# Patient Record
Sex: Female | Born: 1955 | Race: Black or African American | Hispanic: No | Marital: Married | Smoking: Never smoker
Health system: Southern US, Community
[De-identification: ages and names within clinical notes are randomized; demographics above are authoritative.]

## PROBLEM LIST (undated history)

## (undated) DIAGNOSIS — E039 Hypothyroidism, unspecified: Secondary | ICD-10-CM

## (undated) DIAGNOSIS — E119 Type 2 diabetes mellitus without complications: Secondary | ICD-10-CM

## (undated) DIAGNOSIS — M65321 Trigger finger, right index finger: Secondary | ICD-10-CM

## (undated) DIAGNOSIS — K219 Gastro-esophageal reflux disease without esophagitis: Secondary | ICD-10-CM

## (undated) DIAGNOSIS — E785 Hyperlipidemia, unspecified: Secondary | ICD-10-CM

## (undated) DIAGNOSIS — Z860100 Personal history of colon polyps, unspecified: Secondary | ICD-10-CM

## (undated) DIAGNOSIS — I10 Essential (primary) hypertension: Secondary | ICD-10-CM

## (undated) DIAGNOSIS — Z800 Family history of malignant neoplasm of digestive organs: Secondary | ICD-10-CM

## (undated) DIAGNOSIS — N200 Calculus of kidney: Secondary | ICD-10-CM

## (undated) DIAGNOSIS — K589 Irritable bowel syndrome without diarrhea: Secondary | ICD-10-CM

## (undated) DIAGNOSIS — D509 Iron deficiency anemia, unspecified: Secondary | ICD-10-CM

## (undated) HISTORY — DX: Iron deficiency anemia, unspecified: D50.9

## (undated) HISTORY — DX: Irritable bowel syndrome, unspecified: K58.9

## (undated) HISTORY — DX: Essential (primary) hypertension: I10

## (undated) HISTORY — DX: Family history of malignant neoplasm of digestive organs: Z80.0

## (undated) HISTORY — DX: Trigger finger, right index finger: M65.321

## (undated) HISTORY — DX: Hyperlipidemia, unspecified: E78.5

## (undated) HISTORY — DX: Calculus of kidney: N20.0

## (undated) HISTORY — DX: Personal history of colonic polyps: Z86.010

## (undated) HISTORY — DX: Type 2 diabetes mellitus without complications: E11.9

## (undated) HISTORY — DX: Gastro-esophageal reflux disease without esophagitis: K21.9

## (undated) HISTORY — DX: Personal history of colon polyps, unspecified: Z86.0100

## (undated) HISTORY — DX: Hypothyroidism, unspecified: E03.9

---

## 1998-05-31 ENCOUNTER — Other Ambulatory Visit: Admission: RE | Admit: 1998-05-31 | Discharge: 1998-05-31 | Payer: Self-pay | Admitting: Obstetrics and Gynecology

## 1998-06-29 ENCOUNTER — Ambulatory Visit (HOSPITAL_COMMUNITY): Admission: RE | Admit: 1998-06-29 | Discharge: 1998-06-29 | Payer: Self-pay | Admitting: Obstetrics and Gynecology

## 1999-06-26 ENCOUNTER — Other Ambulatory Visit: Admission: RE | Admit: 1999-06-26 | Discharge: 1999-06-26 | Payer: Self-pay | Admitting: Obstetrics and Gynecology

## 2000-08-11 HISTORY — PX: PARTIAL HYSTERECTOMY: SHX80

## 2000-08-12 ENCOUNTER — Other Ambulatory Visit: Admission: RE | Admit: 2000-08-12 | Discharge: 2000-08-12 | Payer: Self-pay | Admitting: Obstetrics and Gynecology

## 2001-10-19 ENCOUNTER — Other Ambulatory Visit: Admission: RE | Admit: 2001-10-19 | Discharge: 2001-10-19 | Payer: Self-pay | Admitting: Obstetrics and Gynecology

## 2002-10-26 ENCOUNTER — Other Ambulatory Visit: Admission: RE | Admit: 2002-10-26 | Discharge: 2002-10-26 | Payer: Self-pay | Admitting: Obstetrics and Gynecology

## 2004-01-04 ENCOUNTER — Other Ambulatory Visit: Admission: RE | Admit: 2004-01-04 | Discharge: 2004-01-04 | Payer: Self-pay | Admitting: Obstetrics and Gynecology

## 2004-03-12 ENCOUNTER — Ambulatory Visit (HOSPITAL_COMMUNITY): Admission: RE | Admit: 2004-03-12 | Discharge: 2004-03-12 | Payer: Self-pay | Admitting: Obstetrics and Gynecology

## 2004-03-21 ENCOUNTER — Observation Stay (HOSPITAL_COMMUNITY): Admission: RE | Admit: 2004-03-21 | Discharge: 2004-03-22 | Payer: Self-pay | Admitting: Obstetrics and Gynecology

## 2005-01-09 ENCOUNTER — Other Ambulatory Visit: Admission: RE | Admit: 2005-01-09 | Discharge: 2005-01-09 | Payer: Self-pay | Admitting: Obstetrics and Gynecology

## 2006-06-16 ENCOUNTER — Other Ambulatory Visit: Admission: RE | Admit: 2006-06-16 | Discharge: 2006-06-16 | Payer: Self-pay | Admitting: Obstetrics and Gynecology

## 2011-11-18 ENCOUNTER — Ambulatory Visit: Payer: Self-pay | Admitting: Obstetrics and Gynecology

## 2011-12-09 ENCOUNTER — Ambulatory Visit (INDEPENDENT_AMBULATORY_CARE_PROVIDER_SITE_OTHER): Payer: BC Managed Care – PPO | Admitting: Obstetrics and Gynecology

## 2011-12-09 ENCOUNTER — Encounter: Payer: Self-pay | Admitting: Obstetrics and Gynecology

## 2011-12-09 VITALS — BP 118/82 | Ht 63.0 in | Wt 156.0 lb

## 2011-12-09 DIAGNOSIS — Z01419 Encounter for gynecological examination (general) (routine) without abnormal findings: Secondary | ICD-10-CM

## 2011-12-09 DIAGNOSIS — Z90710 Acquired absence of both cervix and uterus: Secondary | ICD-10-CM

## 2011-12-09 DIAGNOSIS — E119 Type 2 diabetes mellitus without complications: Secondary | ICD-10-CM | POA: Insufficient documentation

## 2011-12-09 DIAGNOSIS — E663 Overweight: Secondary | ICD-10-CM | POA: Insufficient documentation

## 2011-12-09 DIAGNOSIS — L80 Vitiligo: Secondary | ICD-10-CM | POA: Insufficient documentation

## 2011-12-09 NOTE — Progress Notes (Signed)
The patient is not taking hormone replacement therapy The patient  is not taking a Calcium supplement. Post-menopausal bleeding:no  Last Pap: approximate date 11/07/10 and was normal March  2012 Last mammogram: 07/2011 "WNL" December  2012 Last DEXA scan : None    Last colonoscopy:09/2009 "WNL" February 2011  Urinary symptoms: none Normal bowel movements: No Reports abuse at home: No:   Subjective:    Suzanne Pierce is a 56 y.o. female No obstetric history on file. who presents for annual exam. The patient has no complaints today. The patient has diabetes and her blood sugars are not well controlled.  The following portions of the patient's history were reviewed and updated as appropriate: allergies, current medications, past family history, past medical history, past social history, past surgical history and problem list. See above.  Review of Systems Pertinent items are noted in HPI. Gastrointestinal:No change in bowel habits, no abdominal pain, no rectal bleeding Genitourinary:negative for dysuria, frequency, hematuria, nocturia and urinary incontinence    Objective:     BP 118/82  Ht 5\' 3"  (1.6 m)  Wt 156 lb (70.761 kg)  BMI 27.63 kg/m2  Weight:  Wt Readings from Last 1 Encounters:  12/09/11 156 lb (70.761 kg)     BMI: Body mass index is 27.63 kg/(m^2). General Appearance: Alert, appropriate appearance for age. No acute distress HEENT: Grossly normal Neck / Thyroid: Supple, no masses, nodes or enlargement Lungs: clear to auscultation bilaterally Back: No CVA tenderness Breast Exam: extra nipples and No masses or nodes.No dimpling, nipple retraction or discharge. Cardiovascular: Regular rate and rhythm. S1, S2, no murmur Gastrointestinal: Soft, non-tender, no masses or organomegaly  ++++++++++++++++++++++++++++++++++++++++++++++++++++++++  Pelvic Exam: External genitalia: normal general appearance Vaginal: atrophic mucosa Cervix: normal appearance Adnexa:  normal bimanual exam Uterus: normal size shape and consistency Rectovaginal: not indicated and normal rectal, no masses Pelvic relaxation noted  ++++++++++++++++++++++++++++++++++++++++++++++++++++++++  Lymphatic Exam: Non-palpable nodes in neck, clavicular, axillary, or inguinal regions  Psychiatric: Alert and oriented, appropriate affect.    Urinalysis:Not done      Assessment:    Normal gyn exam   Diabetes, poorly controlled  Overweight or obese: Yes  Pelvic relaxation: Yes  Menopausal symptoms: Yes. Severe: No.   Plan:    Mammogram. Pap smear.   Follow-up:  for annual exam  STD screen request: none, RPR: No, HBsAg: No. Hepatitis C: No.  The updated Pap smear screening guidelines were discussed with the patient. The patient requested that I obtain a Pap smear: Yes.  Kegel exercises discussed: Yes.  Proper diet and regular exercise were reviewed.  Annual mammograms recommended starting at age 62. Proper breast care was discussed.  Screening colonoscopy is recommended beginning at age 72.  Regular health maintenance was reviewed.  Sleep hygiene was discussed.  Adequate calcium and vitamin D intake was emphasized.  Mylinda Latina.D.

## 2011-12-09 NOTE — Patient Instructions (Signed)
Atrophic Vaginitis  Atrophic vaginitis is a problem of low levels of estrogen in women. This problem can happen at any age. It is most common in women who have gone through menopause ("the change").    HOW WILL I KNOW IF I HAVE THIS PROBLEM?  You may have:   Trouble with peeing (urinating), such as:   Going to the bathroom often.   A hard time holding your pee until you reach a bathroom.   Leaking pee.   Having pain when you pee.   Itching or a burning feeling.   Vaginal bleeding and spotting.   Pain during sex.   Dryness of the vagina.   A yellow, bad-smelling fluid (discharge) coming from the vagina.  HOW WILL MY DOCTOR CHECK FOR THIS PROBLEM?   During your exam, your doctor will likely find the problem.   If there is a vaginal fluid, it may be checked for infection.  HOW WILL THIS PROBLEM BE TREATED?  Keep the vulvar skin as clean as possible. Moisturizers and lubricants can help with some of the symptoms.  Estrogen replacement can help. There are 2 ways to take estrogen:   Systemic estrogen gets estrogen to your whole body. It takes many weeks or months before the symptoms get better.   You take an estrogen pill.   You use a skin patch. This is a patch that you put on your skin.   If you still have your uterus, your doctor may ask you to take a hormone. Talk to your doctor about the right medicine for you.   Estrogen cream.  This puts estrogen only at the part of your body where you apply it. The cream is put into the vagina or put on the vulvar skin. For some women, estrogen cream works faster than pills or the patch.  CAN ALL WOMEN WITH THIS PROBLEM USE ESTROGEN?  No. Women with certain types of cancer, liver problems, or problems with blood clots should not take estrogen. Your doctor can help you decide the best treatment for your symptoms.  Document Released: 01/14/2008 Document Revised: 07/17/2011 Document Reviewed: 01/14/2008  [employer] Patient Information 2012 [employer].

## 2014-01-09 HISTORY — PX: LITHOTRIPSY: SUR834

## 2015-10-31 ENCOUNTER — Other Ambulatory Visit: Payer: Self-pay

## 2015-10-31 HISTORY — PX: ESOPHAGOGASTRODUODENOSCOPY: SHX1529

## 2015-12-19 HISTORY — PX: COLONOSCOPY: SHX174

## 2018-10-18 ENCOUNTER — Encounter: Payer: Self-pay | Admitting: Gastroenterology

## 2018-10-26 DIAGNOSIS — N179 Acute kidney failure, unspecified: Secondary | ICD-10-CM

## 2018-10-26 DIAGNOSIS — E860 Dehydration: Secondary | ICD-10-CM

## 2018-10-26 DIAGNOSIS — E1101 Type 2 diabetes mellitus with hyperosmolarity with coma: Secondary | ICD-10-CM

## 2018-10-26 DIAGNOSIS — E039 Hypothyroidism, unspecified: Secondary | ICD-10-CM

## 2018-10-26 DIAGNOSIS — E875 Hyperkalemia: Secondary | ICD-10-CM

## 2018-10-26 DIAGNOSIS — R112 Nausea with vomiting, unspecified: Secondary | ICD-10-CM | POA: Diagnosis not present

## 2018-10-27 DIAGNOSIS — E1101 Type 2 diabetes mellitus with hyperosmolarity with coma: Secondary | ICD-10-CM | POA: Diagnosis not present

## 2018-10-27 DIAGNOSIS — N179 Acute kidney failure, unspecified: Secondary | ICD-10-CM | POA: Diagnosis not present

## 2018-10-27 DIAGNOSIS — R112 Nausea with vomiting, unspecified: Secondary | ICD-10-CM | POA: Diagnosis not present

## 2018-10-27 DIAGNOSIS — R079 Chest pain, unspecified: Secondary | ICD-10-CM | POA: Diagnosis not present

## 2018-10-27 DIAGNOSIS — E860 Dehydration: Secondary | ICD-10-CM | POA: Diagnosis not present

## 2018-10-28 DIAGNOSIS — E860 Dehydration: Secondary | ICD-10-CM | POA: Diagnosis not present

## 2018-10-28 DIAGNOSIS — E1101 Type 2 diabetes mellitus with hyperosmolarity with coma: Secondary | ICD-10-CM | POA: Diagnosis not present

## 2018-10-28 DIAGNOSIS — N179 Acute kidney failure, unspecified: Secondary | ICD-10-CM | POA: Diagnosis not present

## 2018-10-28 DIAGNOSIS — R112 Nausea with vomiting, unspecified: Secondary | ICD-10-CM | POA: Diagnosis not present

## 2018-10-28 DIAGNOSIS — R079 Chest pain, unspecified: Secondary | ICD-10-CM | POA: Diagnosis not present

## 2018-10-29 DIAGNOSIS — E860 Dehydration: Secondary | ICD-10-CM | POA: Diagnosis not present

## 2018-10-29 DIAGNOSIS — E1101 Type 2 diabetes mellitus with hyperosmolarity with coma: Secondary | ICD-10-CM | POA: Diagnosis not present

## 2018-10-29 DIAGNOSIS — N179 Acute kidney failure, unspecified: Secondary | ICD-10-CM | POA: Diagnosis not present

## 2018-10-29 DIAGNOSIS — R112 Nausea with vomiting, unspecified: Secondary | ICD-10-CM | POA: Diagnosis not present

## 2018-11-09 ENCOUNTER — Other Ambulatory Visit: Payer: Self-pay

## 2018-11-09 ENCOUNTER — Telehealth (INDEPENDENT_AMBULATORY_CARE_PROVIDER_SITE_OTHER): Payer: Medicare HMO | Admitting: Gastroenterology

## 2018-11-09 DIAGNOSIS — K580 Irritable bowel syndrome with diarrhea: Secondary | ICD-10-CM | POA: Diagnosis not present

## 2018-11-09 DIAGNOSIS — R197 Diarrhea, unspecified: Secondary | ICD-10-CM

## 2018-11-09 MED ORDER — CHOLESTYRAMINE 4 G PO PACK: PACK | ORAL | 3 refills | Status: DC

## 2018-11-09 NOTE — Patient Instructions (Signed)
We have sent the following medications to your pharmacy for you to pick up at your convenience: Cholesyramine once daily 2 hours before or after all other medications.   Please call Dr. Leland Her nurse in 2 weeks at [phone]  to let her now how you are doing.    You will be due for a recall colonoscopy in 03/2019. We will send you a reminder in the mail when it gets closer to that time.   Thank you,  Dr. Jackquline Denmark

## 2018-11-09 NOTE — Progress Notes (Signed)
Chief Complaint: Diarrhea  Referring Provider:  Raina Mina., MD     This service was provided via telemedicine d/t COVID 19.  The patient was located at home.  The provider was located in office.  The patient did consent to this telephone/zoom visit and is aware of possible charges through their insurance for this visit.   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  Rule out other causes especially infectious causes (including C. Difficile), since she has been on recent antibiotics and had recent hospitalization.  #2.  Family history of colon cancer (dad at age 32)  Plan - Check GI pathogens, WBCs - Cholestyramine 4 g p.o. once a day.  Take it 2 hours before or after the rest of the medications. - Pt to call in 2 weeks.  If not better, will increase cholestyramine to twice daily. - Please obtain previous records [facility] (recent discharge summary/any imaging reports). - Recommend colon Aug 2020 once COVID 19 is over.  Certainly earlier, if she continues to have problems or has any red flags. HPI:    RIANNE DEGRAAF is a 63 y.o. female  Diarrhea x 2 weeks,  D/C [facility] d/t uncontrolled DM, UTI- took A/Bs until last week. Diarrhea at times with the frequency of 10 to 15/day including nocturnal symptoms. No melena or hematochezia No abdominal cramps which to get better with defecation. No weight loss. Denies having any upper GI symptoms including nausea/vomiting, heartburn or regurgitation.  Past GI procedures: - IDA with Hb 10.9 10/2015 -EGD 10/2015: Normal with negative small bowel biopsies -CT 10/2015- neg except for kidney stones. -Colon 12/2015 ([facility])-limited due to quality of preparation, mild sigmoid diverticulosis, small internal hemorrhoids. Neg random colonic biopsies for microscopic colitis. Past Medical History:  Diagnosis Date  . Diabetes ([place])   . Family history of colon  cancer   . GERD (gastroesophageal reflux disease)   . History of colon polyps   . Hyperlipidemia   . Hypertension   . Hypothyroidism   . Iron deficiency anemia   . Irritable bowel syndrome (IBS)    with diarrhea  . Kidney stones   . Trigger index finger of right hand      Family History  Problem Relation Age of Onset  . Colon cancer Father     Social History   Tobacco Use  . Smoking status: Never Smoker  Substance Use Topics  . Alcohol use: No  . Drug use: No    Current Outpatient Medications  Medication Sig Dispense Refill  . amLODipine (NORVASC) 5 MG tablet Take 5 mg by mouth daily.    Marland Kitchen atorvastatin (LIPITOR) 40 MG tablet Take 40 mg by mouth daily.    Marland Kitchen esomeprazole (NEXIUM) 40 MG capsule Take 40 mg by mouth daily.    . fosinopril (MONOPRIL) 20 MG tablet Take 20 mg by mouth daily.    Marland Kitchen glimepiride (AMARYL) 4 MG tablet Take 4 mg by mouth daily.    . insulin detemir (LEVEMIR) 100 UNIT/ML injection Inject into the skin daily.    Marland Kitchen levothyroxine (SYNTHROID, LEVOTHROID) 88 MCG tablet Take 88 mcg by mouth daily.     No current facility-administered medications for this visit.     Allergies  Allergen Reactions  . Penicillins Hives    Review of Systems:  Constitutional: Denies fever, chills, diaphoresis, appetite change and fatigue.  HEENT: Denies photophobia, eye pain,  redness, hearing loss, ear pain, congestion, sore throat, rhinorrhea, sneezing, mouth sores, neck pain, neck stiffness and tinnitus.   Respiratory: Denies SOB, DOE, cough, chest tightness,  and wheezing.   Cardiovascular: Denies chest pain, palpitations and leg swelling.  Genitourinary: Denies dysuria, urgency, frequency, hematuria, flank pain and difficulty urinating.  Musculoskeletal: Has myalgias, back pain, joint swelling, arthralgias and gait problem.  Skin: No rash.  Neurological: Denies dizziness, seizures, syncope, weakness, light-headedness, numbness and headaches.  Hematological: Denies  adenopathy. Easy bruising, personal or family bleeding history  Psychiatric/Behavioral: No anxiety or depression     Physical Exam:    Not examined   Carmell Austria, MD 11/09/2018, 3:13 PM  Cc: Raina Mina., MD

## 2018-12-14 ENCOUNTER — Telehealth: Payer: Self-pay | Admitting: Gastroenterology

## 2018-12-14 ENCOUNTER — Encounter: Payer: Self-pay | Admitting: Gastroenterology

## 2018-12-14 NOTE — Telephone Encounter (Signed)
Patient called said that the medication cholestyramine was helping the first 2 weeks but is not working as well no more. She has continued to have diarrhea

## 2018-12-15 NOTE — Telephone Encounter (Signed)
  The pt has been advised to begin cholestyramine twice daily for now and we will contact her when stool studies have resulted.  She turned those in today. The pt has been advised of the information and verbalized understanding.      Plan - Check GI pathogens, WBCs - Cholestyramine 4 g p.o. once a day.  Take it 2 hours before or after the rest of the medications. - Pt to call in 2 weeks.  If not better, will increase cholestyramine to twice daily. - Please obtain previous records [facility] (recent discharge summary/any imaging reports). - Recommend colon Aug 2020 once COVID 19 is over.  Certainly earlier, if she continues to have problems or has any red flags.

## 2019-02-01 DIAGNOSIS — E1110 Type 2 diabetes mellitus with ketoacidosis without coma: Secondary | ICD-10-CM | POA: Diagnosis not present

## 2019-02-01 DIAGNOSIS — N179 Acute kidney failure, unspecified: Secondary | ICD-10-CM

## 2019-02-01 DIAGNOSIS — N390 Urinary tract infection, site not specified: Secondary | ICD-10-CM | POA: Diagnosis not present

## 2019-02-01 DIAGNOSIS — I129 Hypertensive chronic kidney disease with stage 1 through stage 4 chronic kidney disease, or unspecified chronic kidney disease: Secondary | ICD-10-CM

## 2019-02-01 DIAGNOSIS — E1140 Type 2 diabetes mellitus with diabetic neuropathy, unspecified: Secondary | ICD-10-CM

## 2019-02-01 DIAGNOSIS — N183 Chronic kidney disease, stage 3 (moderate): Secondary | ICD-10-CM

## 2019-02-01 DIAGNOSIS — E1122 Type 2 diabetes mellitus with diabetic chronic kidney disease: Secondary | ICD-10-CM

## 2019-02-01 DIAGNOSIS — E870 Hyperosmolality and hypernatremia: Secondary | ICD-10-CM

## 2019-02-02 DIAGNOSIS — E1110 Type 2 diabetes mellitus with ketoacidosis without coma: Secondary | ICD-10-CM | POA: Diagnosis not present

## 2019-02-02 DIAGNOSIS — N179 Acute kidney failure, unspecified: Secondary | ICD-10-CM | POA: Diagnosis not present

## 2019-02-02 DIAGNOSIS — E870 Hyperosmolality and hypernatremia: Secondary | ICD-10-CM | POA: Diagnosis not present

## 2019-02-02 DIAGNOSIS — N390 Urinary tract infection, site not specified: Secondary | ICD-10-CM | POA: Diagnosis not present

## 2019-02-03 DIAGNOSIS — N390 Urinary tract infection, site not specified: Secondary | ICD-10-CM | POA: Diagnosis not present

## 2019-02-03 DIAGNOSIS — E870 Hyperosmolality and hypernatremia: Secondary | ICD-10-CM | POA: Diagnosis not present

## 2019-02-03 DIAGNOSIS — E1110 Type 2 diabetes mellitus with ketoacidosis without coma: Secondary | ICD-10-CM | POA: Diagnosis not present

## 2019-02-03 DIAGNOSIS — N179 Acute kidney failure, unspecified: Secondary | ICD-10-CM | POA: Diagnosis not present

## 2019-02-04 DIAGNOSIS — N179 Acute kidney failure, unspecified: Secondary | ICD-10-CM | POA: Diagnosis not present

## 2019-02-04 DIAGNOSIS — E1110 Type 2 diabetes mellitus with ketoacidosis without coma: Secondary | ICD-10-CM | POA: Diagnosis not present

## 2019-02-04 DIAGNOSIS — E870 Hyperosmolality and hypernatremia: Secondary | ICD-10-CM | POA: Diagnosis not present

## 2019-02-04 DIAGNOSIS — N390 Urinary tract infection, site not specified: Secondary | ICD-10-CM | POA: Diagnosis not present

## 2019-02-06 DIAGNOSIS — N179 Acute kidney failure, unspecified: Secondary | ICD-10-CM | POA: Diagnosis not present

## 2019-02-06 DIAGNOSIS — E860 Dehydration: Secondary | ICD-10-CM

## 2019-02-06 DIAGNOSIS — E870 Hyperosmolality and hypernatremia: Secondary | ICD-10-CM

## 2019-02-06 DIAGNOSIS — I951 Orthostatic hypotension: Secondary | ICD-10-CM

## 2019-02-06 DIAGNOSIS — E162 Hypoglycemia, unspecified: Secondary | ICD-10-CM

## 2019-02-07 DIAGNOSIS — N179 Acute kidney failure, unspecified: Secondary | ICD-10-CM | POA: Diagnosis not present

## 2019-02-07 DIAGNOSIS — E039 Hypothyroidism, unspecified: Secondary | ICD-10-CM

## 2019-02-07 DIAGNOSIS — E860 Dehydration: Secondary | ICD-10-CM | POA: Diagnosis not present

## 2019-02-07 DIAGNOSIS — K529 Noninfective gastroenteritis and colitis, unspecified: Secondary | ICD-10-CM | POA: Diagnosis not present

## 2019-02-07 DIAGNOSIS — K209 Esophagitis, unspecified: Secondary | ICD-10-CM

## 2019-04-01 ENCOUNTER — Ambulatory Visit: Payer: Medicare HMO | Admitting: Gastroenterology

## 2019-04-01 ENCOUNTER — Encounter: Payer: Self-pay | Admitting: Gastroenterology

## 2019-04-01 ENCOUNTER — Other Ambulatory Visit: Payer: Self-pay

## 2019-04-01 ENCOUNTER — Telehealth: Payer: Self-pay

## 2019-04-01 VITALS — BP 144/86 | HR 90 | Temp 98.0°F | Ht 63.0 in | Wt 154.4 lb

## 2019-04-01 DIAGNOSIS — Z800 Family history of malignant neoplasm of digestive organs: Secondary | ICD-10-CM

## 2019-04-01 DIAGNOSIS — K580 Irritable bowel syndrome with diarrhea: Secondary | ICD-10-CM

## 2019-04-01 MED ORDER — MOTOFEN 1-0.025 MG PO TABS: 1.0000 | ORAL_TABLET | Freq: Three times a day (TID) | ORAL | 0 refills | Status: DC | PRN

## 2019-04-01 NOTE — Patient Instructions (Addendum)
If you are age 61 or older, your body mass index should be between 23-30. Your Body mass index is 27.35 kg/m. If this is out of the aforementioned range listed, please consider follow up with your Primary Care Provider.  If you are age 74 or younger, your body mass index should be between 19-25. Your Body mass index is 27.35 kg/m. If this is out of the aformentioned range listed, please consider follow up with your Primary Care Provider.   We have given you samples of the following medication to take: Motofen one tablet every 8 hours as needed.   You have been scheduled for a colonoscopy. Please follow written instructions given to you at your visit today.  Please pick up your prep supplies at the pharmacy within the next 1-3 days. If you use inhalers (even only as needed), please bring them with you on the day of your procedure. Your physician has requested that you go to www.startemmi.com and enter the access code given to you at your visit today. This web site gives a general overview about your procedure. However, you should still follow specific instructions given to you by our office regarding your preparation for the procedure.  Thank you,  Dr. Jackquline Denmark

## 2019-04-01 NOTE — Progress Notes (Signed)
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  Rule out other causes especially infectious causes (including C. Difficile), since she has been on recent antibiotics and had recent hospitalization. Neg stool studies 12/2018. Failed cholestryamine  #2.  Family history of colon cancer (dad at age 61)  Plan  - Proceed with colonoscopy with miralax. Discussed risks & benefits. (Risks including rare perforation req laparotomy, bleeding after biopsies/polypectomy req blood transfusion, rare chance of missing neoplasms, risks of anesthesia/sedation). Benefits outweigh the risks. Patient agrees to proceed. All the questions were answered. Consent forms given for review. - CT report from [facility] 1 tab po Q8hrs prn (samples given)  HPI:    Suzanne Pierce is a 63 y.o. female  Diarrhea much better Now 2-3/week D/C [facility] d/t uncontrolled DM, UTI- took A/Bs until last week. No melena or hematochezia No abdominal cramps which to get better with defecation. No weight loss. Denies having any upper GI symptoms including nausea/vomiting, heartburn or regurgitation.  Past GI procedures: - IDA with Hb 10.9 10/2015 -EGD 10/2015: Normal with negative small bowel biopsies -CT 10/2015- neg except for kidney stones. -Colon 12/2015 ([facility])-limited due to quality of preparation, mild sigmoid diverticulosis, small internal hemorrhoids. Neg random colonic biopsies for microscopic colitis. Past Medical History:  Diagnosis Date  . Diabetes ([place])   . Family history of colon cancer   . GERD (gastroesophageal reflux disease)   . History of colon polyps   . Hyperlipidemia   . Hypertension   . Hypothyroidism   . Iron deficiency anemia   . Irritable bowel syndrome (IBS)    with diarrhea  . Kidney stones   . Trigger index finger of right hand      Family History  Problem Relation  Age of Onset  . Colon cancer Father     Social History   Tobacco Use  . Smoking status: Never Smoker  . Smokeless tobacco: Never Used  Substance Use Topics  . Alcohol use: No  . Drug use: No    Current Outpatient Medications  Medication Sig Dispense Refill  . amLODipine (NORVASC) 5 MG tablet Take 5 mg by mouth daily.    Marland Kitchen atorvastatin (LIPITOR) 40 MG tablet Take 40 mg by mouth daily.    . BD PEN NEEDLE NANO 2ND GEN 32G X 4 MM MISC as directed. use as directed    . Blood Glucose Monitoring Suppl (FIFTY50 GLUCOSE METER 2.0) w/Device KIT as directed.    Marland Kitchen FERROUS SULFATE PO Take 1 tablet by mouth daily.    Marland Kitchen gabapentin (NEURONTIN) 300 MG capsule 1 capsule as directed. Take every 6 ours and 2 at bedtime    . insulin detemir (LEVEMIR) 100 UNIT/ML injection Inject into the skin daily.    Marland Kitchen levothyroxine (SYNTHROID, LEVOTHROID) 88 MCG tablet Take 88 mcg by mouth daily.    . timolol (TIMOPTIC) 0.5 % ophthalmic solution 1 drop 2 (two) times daily.    . cholestyramine (QUESTRAN) 4 g packet Take once daily 2 hours before or after rest of medications. (Patient not taking: Reported on 04/01/2019) 30 each 3  . esomeprazole (NEXIUM) 40 MG capsule Take 40 mg by mouth daily.    . fosinopril (MONOPRIL) 20 MG tablet Take 20 mg by mouth daily.    Marland Kitchen glimepiride (AMARYL) 4 MG tablet Take 4 mg by mouth daily.    Marland Kitchen  pantoprazole (PROTONIX) 40 MG tablet as directed.     No current facility-administered medications for this visit.     Allergies  Allergen Reactions  . Penicillins Hives    Review of Systems:  neg    Physical Exam:    Today's Vitals   04/01/19 1501  BP: (!) 144/86  Pulse: 90  Temp: 98 F (36.7 C)  Weight: 154 lb 6 oz (70 kg)  Height: 5' 3"  (1.6 m)   Body mass index is 27.35 kg/m. HEENT: No jaundice. CVS: S1-S2 normal no S3 or S4. Lungs: Clear. Abdo: Normal.  No definite hepatosplenomegaly.  Bowel sounds are present. I spent 15 minutes of face-to-face time with the  patient. Greater than 50% of the time was spent counseling and coordinating care.   Carmell Austria, MD 04/01/2019, 3:17 PM  Cc: Myrlene Broker, MD

## 2019-04-01 NOTE — Telephone Encounter (Signed)
Covid-19 screening questions   Do you now or have you had a fever in the last 14 days? No  Do you have any respiratory symptoms of shortness of breath or cough now or in the last 14 days? No  Do you have any family members or close contacts with diagnosed or suspected Covid-19 in the past 14 days? No  Have you been tested for Covid-19 and found to be positive? Was tested but negative per patient

## 2019-04-20 ENCOUNTER — Encounter: Payer: Self-pay | Admitting: Gastroenterology

## 2019-04-29 ENCOUNTER — Other Ambulatory Visit: Payer: Self-pay

## 2019-04-29 ENCOUNTER — Other Ambulatory Visit: Payer: Self-pay | Admitting: Gastroenterology

## 2019-04-29 ENCOUNTER — Encounter: Payer: Self-pay | Admitting: Gastroenterology

## 2019-04-29 ENCOUNTER — Encounter: Payer: Medicare HMO | Admitting: Gastroenterology

## 2019-04-29 ENCOUNTER — Ambulatory Visit (AMBULATORY_SURGERY_CENTER): Payer: Medicare HMO | Admitting: Gastroenterology

## 2019-04-29 VITALS — BP 120/65 | HR 75 | Temp 97.9°F | Resp 13 | Ht 63.0 in | Wt 154.0 lb

## 2019-04-29 DIAGNOSIS — D128 Benign neoplasm of rectum: Secondary | ICD-10-CM

## 2019-04-29 DIAGNOSIS — K529 Noninfective gastroenteritis and colitis, unspecified: Secondary | ICD-10-CM

## 2019-04-29 DIAGNOSIS — K5730 Diverticulosis of large intestine without perforation or abscess without bleeding: Secondary | ICD-10-CM | POA: Diagnosis not present

## 2019-04-29 DIAGNOSIS — K648 Other hemorrhoids: Secondary | ICD-10-CM

## 2019-04-29 DIAGNOSIS — Z800 Family history of malignant neoplasm of digestive organs: Secondary | ICD-10-CM | POA: Diagnosis not present

## 2019-04-29 DIAGNOSIS — K580 Irritable bowel syndrome with diarrhea: Secondary | ICD-10-CM

## 2019-04-29 MED ORDER — SODIUM CHLORIDE 0.9 % IV SOLN: 500.0000 mL | Freq: Once | INTRAVENOUS | Status: DC

## 2019-04-29 NOTE — Progress Notes (Signed)
Report to PACU, RN, vss, BBS= Clear.  

## 2019-04-29 NOTE — Progress Notes (Signed)
Pt's states no medical or surgical changes since previsit or office visit.  KA temp, CW vitALS AND SH IV

## 2019-04-29 NOTE — Patient Instructions (Signed)
YOU HAD AN ENDOSCOPIC PROCEDURE TODAY AT [facility]:   Refer to the procedure report that was given to you for any specific questions about what was found during the examination.  If the procedure report does not answer your questions, please call your gastroenterologist to clarify.  If you requested that your care partner not be given the details of your procedure findings, then the procedure report has been included in a sealed envelope for you to review at your convenience later.  YOU SHOULD EXPECT: Some feelings of bloating in the abdomen. Passage of more gas than usual.  Walking can help get rid of the air that was put into your GI tract during the procedure and reduce the bloating. If you had a lower endoscopy (such as a colonoscopy or flexible sigmoidoscopy) you may notice spotting of blood in your stool or on the toilet paper. If you underwent a bowel prep for your procedure, you may not have a normal bowel movement for a few days.  Please Note:  You might notice some irritation and congestion in your nose or some drainage.  This is from the oxygen used during your procedure.  There is no need for concern and it should clear up in a day or so.  SYMPTOMS TO REPORT IMMEDIATELY:   Following lower endoscopy (colonoscopy or flexible sigmoidoscopy):  Excessive amounts of blood in the stool  Significant tenderness or worsening of abdominal pains  Swelling of the abdomen that is new, acute  Fever of 100F or higher For urgent or emergent issues, a gastroenterologist can be reached at any hour by calling [phone].  DIET:  We do recommend a small meal at first, but then you may proceed to your regular diet.  Drink plenty of fluids but you should avoid alcoholic beverages for 24 hours.  ACTIVITY:  You should plan to take it easy for the rest of today and you should NOT DRIVE or use heavy machinery until tomorrow (because of the sedation medicines used during the test).     FOLLOW UP: Our staff will call the number listed on your records 48-72 hours following your procedure to check on you and address any questions or concerns that you may have regarding the information given to you following your procedure. If we do not reach you, we will leave a message.  We will attempt to reach you two times.  During this call, we will ask if you have developed any symptoms of COVID 19. If you develop any symptoms (ie: fever, flu-like symptoms, shortness of breath, cough etc.) before then, please call [phone].  If you test positive for Covid 19 in the 2 weeks post procedure, please call and report this information to [place].    If any biopsies were taken you will be contacted by phone or by letter within the next 1-3 weeks.  Please call us at [phone] if you have not heard about the biopsies in 3 weeks.    SIGNATURES/CONFIDENTIALITY: You and/or your care partner have signed paperwork which will be entered into your electronic medical record.  These signatures attest to the fact that that the information above on your After Visit Summary has been reviewed and is understood.  Full responsibility of the confidentiality of this discharge information lies with you and/or your care-partner.  Await pathology  Dr. Lyndel Safe would like to see you in the office in 6 months  Continue your normal medications  Please read over handouts about polyps and  diverticulosis

## 2019-04-29 NOTE — Progress Notes (Signed)
Called to room to assist during endoscopic procedure.  Patient ID and intended procedure confirmed with present staff. Received instructions for my participation in the procedure from the performing physician.  

## 2019-04-29 NOTE — Op Note (Signed)
[place] Patient Name: Suzanne Pierce Procedure Date: 04/29/2019 1:25 PM MRN: [id number] Endoscopist: Jackquline Denmark , MD Age: 63 Referring MD:  Date of Birth: 11/01/1955 Gender: Female Account #: [id number] Procedure:                Colonoscopy Indications:              Chronic diarrhea, FH of colon cancer (dad at age 98) Medicines:                Monitored Anesthesia Care Procedure:                Pre-Anesthesia Assessment:                           - Prior to the procedure, a History and Physical                            was performed, and patient medications and                            allergies were reviewed. The patient's tolerance of                            previous anesthesia was also reviewed. The risks                            and benefits of the procedure and the sedation                            options and risks were discussed with the patient.                            All questions were answered, and informed consent                            was obtained. Prior Anticoagulants: The patient has                            taken no previous anticoagulant or antiplatelet                            agents. ASA Grade Assessment: II - A patient with                            mild systemic disease. After reviewing the risks                            and benefits, the patient was deemed in                            satisfactory condition to undergo the procedure.                           After obtaining informed consent, the colonoscope  was passed under direct vision. Throughout the                            procedure, the patient's blood pressure, pulse, and                            oxygen saturations were monitored continuously. The                            Colonoscope was introduced through the anus and                            advanced to the 2 cm into the ileum. The                            colonoscopy  was performed without difficulty. The                            patient tolerated the procedure well. The quality                            of the bowel preparation was good. The terminal                            ileum, ileocecal valve, appendiceal orifice, and                            rectum were photographed. Scope In: 1:37:28 PM Scope Out: 1:51:16 PM Scope Withdrawal Time: 0 hours 6 minutes 57 seconds  Total Procedure Duration: 0 hours 13 minutes 48 seconds  Findings:                 A 6 mm polyp was found in the rectum. The polyp was                            sessile. The polyp was removed with a cold snare.                            Resection and retrieval were complete.                           A few small-mouthed diverticula were found in the                            sigmoid colon.                           The colon (entire examined portion) appeared                            normal. Biopsies for histology were taken with a                            cold forceps from the entire colon for evaluation  of microscopic colitis.                           Non-bleeding internal hemorrhoids were found during                            retroflexion. The hemorrhoids were small.                           The terminal ileum appeared normal.                           The exam was otherwise without abnormality on                            direct and retroflexion views. Complications:            No immediate complications. Estimated Blood Loss:     Estimated blood loss: none. Impression:               -One 6 mm polyp in the rectum, removed with a cold                            snare. Resected and retrieved.                           -Mild sigmoid diverticulosis.                           -Otherwise normal colonoscopy to TI. Recommendation:           - Patient has a contact number available for                            emergencies. The signs and  symptoms of potential                            delayed complications were discussed with the                            patient. Return to normal activities tomorrow.                            Written discharge instructions were provided to the                            patient.                           - Resume previous diet.                           - Continue present medications.                           - Await pathology results.                           -  Repeat colonoscopy for surveillance based on                            pathology results.                           - Return to GI clinic in 6 months. Jackquline Denmark, MD 04/29/2019 1:56:54 PM This report has been signed electronically.

## 2019-05-03 ENCOUNTER — Telehealth: Payer: Self-pay

## 2019-05-03 NOTE — Telephone Encounter (Signed)
  Follow up Call-  Call back number 04/29/2019  Post procedure Call Back phone  # [phone]  Permission to leave phone message Yes  Some recent data might be hidden     Patient questions:  Do you have a fever, pain , or abdominal swelling? No. Pain Score  0 *  Have you tolerated food without any problems? Yes.    Have you been able to return to your normal activities? Yes.    Do you have any questions about your discharge instructions: Diet   No. Medications  No. Follow up visit  No.  Do you have questions or concerns about your Care? No.  Actions: * If pain score is 4 or above: No action needed, pain <4.  1. Have you developed a fever since your procedure? no  2.   Have you had an respiratory symptoms (SOB or cough) since your procedure? no  3.   Have you tested positive for COVID 19 since your procedure no  4.   Have you had any family members/close contacts diagnosed with the COVID 19 since your procedure?  no   If yes to any of these questions please route to Joylene John, RN and Alphonsa Gin, Therapist, sports.

## 2019-05-03 NOTE — Telephone Encounter (Signed)
No answer, left message to call back later today, B.Jolan Mealor RN. 

## 2019-05-08 ENCOUNTER — Encounter: Payer: Self-pay | Admitting: Gastroenterology

## 2019-06-15 ENCOUNTER — Encounter (HOSPITAL_COMMUNITY): Payer: Self-pay | Admitting: Emergency Medicine

## 2019-06-15 ENCOUNTER — Inpatient Hospital Stay (HOSPITAL_COMMUNITY)
Admission: EM | Admit: 2019-06-15 | Discharge: 2019-06-19 | DRG: 392 | Disposition: A | Discharge status: Home / Self Care | Payer: Medicare HMO | Attending: Family Medicine | Admitting: Family Medicine

## 2019-06-15 ENCOUNTER — Emergency Department (HOSPITAL_COMMUNITY): Payer: Medicare HMO

## 2019-06-15 ENCOUNTER — Other Ambulatory Visit: Payer: Self-pay

## 2019-06-15 DIAGNOSIS — E039 Hypothyroidism, unspecified: Secondary | ICD-10-CM | POA: Diagnosis present

## 2019-06-15 DIAGNOSIS — K3189 Other diseases of stomach and duodenum: Secondary | ICD-10-CM | POA: Diagnosis present

## 2019-06-15 DIAGNOSIS — R112 Nausea with vomiting, unspecified: Secondary | ICD-10-CM | POA: Diagnosis not present

## 2019-06-15 DIAGNOSIS — Z90711 Acquired absence of uterus with remaining cervical stump: Secondary | ICD-10-CM

## 2019-06-15 DIAGNOSIS — E1169 Type 2 diabetes mellitus with other specified complication: Secondary | ICD-10-CM | POA: Diagnosis present

## 2019-06-15 DIAGNOSIS — Z79890 Hormone replacement therapy (postmenopausal): Secondary | ICD-10-CM

## 2019-06-15 DIAGNOSIS — N179 Acute kidney failure, unspecified: Secondary | ICD-10-CM | POA: Diagnosis present

## 2019-06-15 DIAGNOSIS — K2100 Gastro-esophageal reflux disease with esophagitis, without bleeding: Secondary | ICD-10-CM | POA: Diagnosis present

## 2019-06-15 DIAGNOSIS — D509 Iron deficiency anemia, unspecified: Secondary | ICD-10-CM | POA: Diagnosis present

## 2019-06-15 DIAGNOSIS — E870 Hyperosmolality and hypernatremia: Secondary | ICD-10-CM | POA: Diagnosis present

## 2019-06-15 DIAGNOSIS — E860 Dehydration: Secondary | ICD-10-CM | POA: Diagnosis not present

## 2019-06-15 DIAGNOSIS — G8929 Other chronic pain: Secondary | ICD-10-CM

## 2019-06-15 DIAGNOSIS — E1165 Type 2 diabetes mellitus with hyperglycemia: Secondary | ICD-10-CM | POA: Diagnosis present

## 2019-06-15 DIAGNOSIS — K30 Functional dyspepsia: Principal | ICD-10-CM | POA: Diagnosis present

## 2019-06-15 DIAGNOSIS — Z8249 Family history of ischemic heart disease and other diseases of the circulatory system: Secondary | ICD-10-CM

## 2019-06-15 DIAGNOSIS — Z880 Allergy status to penicillin: Secondary | ICD-10-CM

## 2019-06-15 DIAGNOSIS — R350 Frequency of micturition: Secondary | ICD-10-CM | POA: Diagnosis present

## 2019-06-15 DIAGNOSIS — K2970 Gastritis, unspecified, without bleeding: Secondary | ICD-10-CM | POA: Diagnosis present

## 2019-06-15 DIAGNOSIS — Z8719 Personal history of other diseases of the digestive system: Secondary | ICD-10-CM

## 2019-06-15 DIAGNOSIS — R739 Hyperglycemia, unspecified: Secondary | ICD-10-CM | POA: Diagnosis present

## 2019-06-15 DIAGNOSIS — R630 Anorexia: Secondary | ICD-10-CM | POA: Diagnosis present

## 2019-06-15 DIAGNOSIS — Z20828 Contact with and (suspected) exposure to other viral communicable diseases: Secondary | ICD-10-CM | POA: Diagnosis present

## 2019-06-15 DIAGNOSIS — Z794 Long term (current) use of insulin: Secondary | ICD-10-CM

## 2019-06-15 DIAGNOSIS — N1832 Chronic kidney disease, stage 3b: Secondary | ICD-10-CM | POA: Diagnosis present

## 2019-06-15 DIAGNOSIS — Z79899 Other long term (current) drug therapy: Secondary | ICD-10-CM

## 2019-06-15 DIAGNOSIS — E1122 Type 2 diabetes mellitus with diabetic chronic kidney disease: Secondary | ICD-10-CM | POA: Diagnosis present

## 2019-06-15 DIAGNOSIS — E119 Type 2 diabetes mellitus without complications: Secondary | ICD-10-CM

## 2019-06-15 DIAGNOSIS — N200 Calculus of kidney: Secondary | ICD-10-CM | POA: Diagnosis present

## 2019-06-15 DIAGNOSIS — N390 Urinary tract infection, site not specified: Secondary | ICD-10-CM

## 2019-06-15 DIAGNOSIS — K209 Esophagitis, unspecified without bleeding: Secondary | ICD-10-CM

## 2019-06-15 DIAGNOSIS — Z9049 Acquired absence of other specified parts of digestive tract: Secondary | ICD-10-CM

## 2019-06-15 DIAGNOSIS — E785 Hyperlipidemia, unspecified: Secondary | ICD-10-CM | POA: Diagnosis present

## 2019-06-15 DIAGNOSIS — I129 Hypertensive chronic kidney disease with stage 1 through stage 4 chronic kidney disease, or unspecified chronic kidney disease: Secondary | ICD-10-CM | POA: Diagnosis present

## 2019-06-15 LAB — POCT I-STAT EG7
Bicarbonate: 24.1 mmol/L (ref 20.0–28.0)
Calcium, Ion: 1.3 mmol/L (ref 1.15–1.40)
HCT: 41 % (ref 36.0–46.0)
Hemoglobin: 13.9 g/dL (ref 12.0–15.0)
O2 Saturation: 58 %
Potassium: 4.6 mmol/L (ref 3.5–5.1)
Sodium: 148 mmol/L — ABNORMAL HIGH (ref 135–145)
TCO2: 25 mmol/L (ref 22–32)
pCO2, Ven: 37.1 mmHg — ABNORMAL LOW (ref 44.0–60.0)
pH, Ven: 7.421 (ref 7.250–7.430)
pO2, Ven: 29 mmHg — CL (ref 32.0–45.0)

## 2019-06-15 LAB — COMPREHENSIVE METABOLIC PANEL
ALT: 20 U/L (ref 0–44)
AST: 17 U/L (ref 15–41)
Albumin: 3.9 g/dL (ref 3.5–5.0)
Alkaline Phosphatase: 154 U/L — ABNORMAL HIGH (ref 38–126)
Anion gap: 15 (ref 5–15)
BUN: 40 mg/dL — ABNORMAL HIGH (ref 8–23)
CO2: 23 mmol/L (ref 22–32)
Calcium: 10 mg/dL (ref 8.9–10.3)
Chloride: 108 mmol/L (ref 98–111)
Creatinine, Ser: 2 mg/dL — ABNORMAL HIGH (ref 0.44–1.00)
GFR calc Af Amer: 30 mL/min — ABNORMAL LOW (ref >60)
GFR calc non Af Amer: 26 mL/min — ABNORMAL LOW (ref >60)
Glucose, Bld: 419 mg/dL — ABNORMAL HIGH (ref 70–99)
Potassium: 4.8 mmol/L (ref 3.5–5.1)
Sodium: 146 mmol/L — ABNORMAL HIGH (ref 135–145)
Total Bilirubin: 0.8 mg/dL (ref 0.3–1.2)
Total Protein: 7.9 g/dL (ref 6.5–8.1)

## 2019-06-15 LAB — HEMOGLOBIN A1C
Hgb A1c MFr Bld: 11.5 % — ABNORMAL HIGH (ref 4.8–5.6)
Mean Plasma Glucose: 283.35 mg/dL

## 2019-06-15 LAB — CBC
HCT: 43.9 % (ref 36.0–46.0)
HCT: 40 % (ref 36.0–46.0)
Hemoglobin: 13.5 g/dL (ref 12.0–15.0)
Hemoglobin: 12.3 g/dL (ref 12.0–15.0)
MCH: 25.4 pg — ABNORMAL LOW (ref 26.0–34.0)
MCH: 25.5 pg — ABNORMAL LOW (ref 26.0–34.0)
MCHC: 30.8 g/dL (ref 30.0–36.0)
MCHC: 30.8 g/dL (ref 30.0–36.0)
MCV: 82.7 fL (ref 80.0–100.0)
MCV: 83 fL (ref 80.0–100.0)
Platelets: 277 10*3/uL (ref 150–400)
Platelets: 233 10*3/uL (ref 150–400)
RBC: 5.31 MIL/uL — ABNORMAL HIGH (ref 3.87–5.11)
RBC: 4.82 MIL/uL (ref 3.87–5.11)
RDW: 14 % (ref 11.5–15.5)
RDW: 14 % (ref 11.5–15.5)
WBC: 14.9 10*3/uL — ABNORMAL HIGH (ref 4.0–10.5)
WBC: 13.6 10*3/uL — ABNORMAL HIGH (ref 4.0–10.5)
nRBC: 0 % (ref 0.0–0.2)
nRBC: 0 % (ref 0.0–0.2)

## 2019-06-15 LAB — URINALYSIS, ROUTINE W REFLEX MICROSCOPIC
Bilirubin Urine: NEGATIVE
Glucose, UA: >=500 mg/dL — AB
Ketones, ur: 5 mg/dL — AB
Nitrite: NEGATIVE
Protein, ur: 100 mg/dL — AB
Specific Gravity, Urine: 1.013 (ref 1.005–1.030)
WBC, UA: >50 WBC/hpf — ABNORMAL HIGH (ref 0–5)
pH: 5 (ref 5.0–8.0)

## 2019-06-15 LAB — CBG MONITORING, ED: Glucose-Capillary: 279 mg/dL — ABNORMAL HIGH (ref 70–99)

## 2019-06-15 LAB — CREATININE, SERUM
Creatinine, Ser: 2.08 mg/dL — ABNORMAL HIGH (ref 0.44–1.00)
GFR calc Af Amer: 29 mL/min — ABNORMAL LOW (ref >60)
GFR calc non Af Amer: 25 mL/min — ABNORMAL LOW (ref >60)

## 2019-06-15 LAB — LACTIC ACID, PLASMA
Lactic Acid, Venous: 2.3 mmol/L (ref 0.5–1.9)
Lactic Acid, Venous: 2.2 mmol/L (ref 0.5–1.9)

## 2019-06-15 LAB — HIV ANTIBODY (ROUTINE TESTING W REFLEX): HIV Screen 4th Generation wRfx: NONREACTIVE

## 2019-06-15 LAB — LIPASE, BLOOD: Lipase: 22 U/L (ref 11–51)

## 2019-06-15 MED ORDER — ONDANSETRON HCL 4 MG/2ML IJ SOLN
4.0000 mg | Freq: Once | INTRAMUSCULAR | Status: AC
  Administered 2019-06-15: 4 mg via INTRAVENOUS
  Filled 2019-06-15: qty 2

## 2019-06-15 MED ORDER — SODIUM CHLORIDE 0.9% FLUSH: 3.0000 mL | Freq: Once | INTRAVENOUS | Status: DC

## 2019-06-15 MED ORDER — MORPHINE SULFATE (PF) 4 MG/ML IV SOLN
4.0000 mg | Freq: Once | INTRAVENOUS | Status: AC
  Administered 2019-06-15: 4 mg via INTRAVENOUS
  Filled 2019-06-15: qty 1

## 2019-06-15 MED ORDER — SODIUM CHLORIDE 0.9 % IV SOLN
INTRAVENOUS | Status: DC
  Administered 2019-06-16: 07:00:00 via INTRAVENOUS

## 2019-06-15 MED ORDER — INSULIN ASPART 100 UNIT/ML ~~LOC~~ SOLN
0.0000 [IU] | Freq: Every day | SUBCUTANEOUS | Status: DC
  Administered 2019-06-15 – 2019-06-17 (×2): 3 [IU] via SUBCUTANEOUS
  Administered 2019-06-18: 2 [IU] via SUBCUTANEOUS

## 2019-06-15 MED ORDER — SODIUM CHLORIDE 0.9 % IV SOLN
1.0000 g | INTRAVENOUS | Status: DC
  Administered 2019-06-15 – 2019-06-17 (×3): 1 g via INTRAVENOUS
  Filled 2019-06-15: qty 10
  Filled 2019-06-15 (×2): qty 1
  Filled 2019-06-15 (×2): qty 10

## 2019-06-15 MED ORDER — INSULIN ASPART 100 UNIT/ML ~~LOC~~ SOLN
8.0000 [IU] | Freq: Once | SUBCUTANEOUS | Status: AC
  Administered 2019-06-15: 8 [IU] via INTRAVENOUS

## 2019-06-15 MED ORDER — ACETAMINOPHEN 650 MG RE SUPP: 650.0000 mg | Freq: Four times a day (QID) | RECTAL | Status: DC | PRN

## 2019-06-15 MED ORDER — PANTOPRAZOLE SODIUM 40 MG IV SOLR
40.0000 mg | Freq: Once | INTRAVENOUS | Status: AC
  Administered 2019-06-15: 40 mg via INTRAVENOUS
  Filled 2019-06-15: qty 40

## 2019-06-15 MED ORDER — FAMOTIDINE IN NACL 20-0.9 MG/50ML-% IV SOLN
20.0000 mg | Freq: Once | INTRAVENOUS | Status: AC
  Administered 2019-06-15: 20 mg via INTRAVENOUS
  Filled 2019-06-15: qty 50

## 2019-06-15 MED ORDER — SODIUM CHLORIDE 0.9 % IV SOLN: 1.0000 g | Freq: Once | INTRAVENOUS | Status: DC

## 2019-06-15 MED ORDER — SODIUM CHLORIDE 0.9 % IV BOLUS
1000.0000 mL | Freq: Once | INTRAVENOUS | Status: AC
  Administered 2019-06-15: 1000 mL via INTRAVENOUS

## 2019-06-15 MED ORDER — INSULIN ASPART 100 UNIT/ML ~~LOC~~ SOLN
0.0000 [IU] | Freq: Three times a day (TID) | SUBCUTANEOUS | Status: DC
  Administered 2019-06-16: 11 [IU] via SUBCUTANEOUS
  Administered 2019-06-16 (×2): 4 [IU] via SUBCUTANEOUS
  Administered 2019-06-18: 3 [IU] via SUBCUTANEOUS
  Administered 2019-06-18 – 2019-06-19 (×2): 7 [IU] via SUBCUTANEOUS
  Administered 2019-06-19: 4 [IU] via SUBCUTANEOUS

## 2019-06-15 MED ORDER — ONDANSETRON HCL 4 MG PO TABS
4.0000 mg | ORAL_TABLET | Freq: Four times a day (QID) | ORAL | Status: DC | PRN
  Administered 2019-06-16: 4 mg via ORAL
  Filled 2019-06-15: qty 1

## 2019-06-15 MED ORDER — HEPARIN SODIUM (PORCINE) 5000 UNIT/ML IJ SOLN
5000.0000 [IU] | Freq: Three times a day (TID) | INTRAMUSCULAR | Status: DC
  Administered 2019-06-15 – 2019-06-19 (×11): 5000 [IU] via SUBCUTANEOUS
  Filled 2019-06-15 (×11): qty 1

## 2019-06-15 MED ORDER — ACETAMINOPHEN 325 MG PO TABS
650.0000 mg | ORAL_TABLET | Freq: Four times a day (QID) | ORAL | Status: DC | PRN
  Administered 2019-06-16: 650 mg via ORAL
  Filled 2019-06-15: qty 2

## 2019-06-15 MED ORDER — ONDANSETRON HCL 4 MG/2ML IJ SOLN
4.0000 mg | Freq: Four times a day (QID) | INTRAMUSCULAR | Status: DC | PRN
  Administered 2019-06-16 – 2019-06-18 (×5): 4 mg via INTRAVENOUS
  Filled 2019-06-15 (×4): qty 2

## 2019-06-15 NOTE — H&P (Signed)
History and Physical   Suzanne Pierce [id number] DOB: [birth year] DOA: 06/15/2019  Referring MD/NP/PA: Dr. Zenia Resides  PCP: Myrlene Broker, MD   Outpatient Specialists: None  Patient coming from: Home  Chief Complaint: Intractable nausea and vomiting  HPI: Suzanne Pierce is a 63 y.o. female with medical history significant of insulin-dependent diabetes, hypertension, hyperlipidemia, hypothyroidism, GERD who was seen at [facility] a few days ago with persistent nausea vomiting abdominal discomfort.  She was seen yesterday and diagnosed with possible DKA.  Patient aggressively hydrated and sent home.  She continues to do poorly and her PCP was called.  Recommended patient on the hospital come to [facility].  She is still dehydrated but not in DKA.  Blood sugar more than 400.  Patient weak and visibly debilitated.  She has been admitted with intractable nausea vomiting dehydration.  Also hyperglycemia.  No sick contacts.  No diarrhea..  ED Course: Temperature is 98 for blood pressure 143/82, pulse 190 respirate 19 oxygen sat 95% room air.  ABG showed normal pH.  Creatinine 2.08.  Lactic acid 2.2 white count 13.7 platelet 12.3 and platelet count of 233.  HIV screen is nonreactive.  A1c 11.5, sodium 146 potassium 4.8 chloride 108 CO2 23 initial glucose was 419 BUN 40.  LFTs appear to be within normal.  Patient appears significantly dehydrated and has been admitted for observation and treatment  Review of Systems: As per HPI otherwise 10 point review of systems negative.    Past Medical History:  Diagnosis Date  . Diabetes ([place])   . Family history of colon cancer   . GERD (gastroesophageal reflux disease)   . History of colon polyps   . Hyperlipidemia   . Hypertension   . Hypothyroidism   . Iron deficiency anemia   . Irritable bowel syndrome (IBS)    with diarrhea  . Kidney stones   . Trigger index finger of right hand     Past Surgical History:   Procedure Laterality Date  . COLONOSCOPY  12/19/2015   Mild sigmoid diverticulosis. Small internal hemorrhoids  . ESOPHAGOGASTRODUODENOSCOPY  10/31/2015   Normal EGD  . LITHOTRIPSY  01/2014  . PARTIAL HYSTERECTOMY  2002     reports that she has never smoked. She has never used smokeless tobacco. She reports that she does not drink alcohol or use drugs.  Allergies  Allergen Reactions  . Penicillins Hives    Family History  Problem Relation Age of Onset  . Colon cancer Father   . Rectal cancer Neg Hx      Prior to Admission medications   Medication Sig Start Date End Date Taking? Authorizing Provider  amLODipine (NORVASC) 5 MG tablet Take 5 mg by mouth daily.    [provider]  atorvastatin (LIPITOR) 40 MG tablet Take 40 mg by mouth daily.    [provider]  BD PEN NEEDLE NANO 2ND GEN 32G X 4 MM MISC as directed. use as directed 03/15/19   [provider]  Blood Glucose Monitoring Suppl (FIFTY50 GLUCOSE METER 2.0) w/Device KIT as directed. 11/22/18   [provider]  cholestyramine (QUESTRAN) 4 g packet Take once daily 2 hours before or after rest of medications. Patient not taking: Reported on 04/01/2019 11/09/18   Jackquline Denmark, MD  Difenoxin-Atropine (MOTOFEN) 1-0.025 MG TABS Take 1 tablet by mouth every 8 (eight) hours as needed. Patient not taking: Reported on 04/29/2019 04/01/19   Jackquline Denmark, MD  esomeprazole (NEXIUM) 40 MG capsule Take  40 mg by mouth daily.    [provider]  FERROUS SULFATE PO Take 1 tablet by mouth daily.    [provider]  fosinopril (MONOPRIL) 20 MG tablet Take 20 mg by mouth daily.    [provider]  gabapentin (NEURONTIN) 300 MG capsule 1 capsule as directed. Take every 6 ours and 2 at bedtime 03/10/19   [provider]  glimepiride (AMARYL) 4 MG tablet Take 4 mg by mouth daily.    [provider]  insulin detemir (LEVEMIR) 100 UNIT/ML injection Inject into the skin  daily.    [provider]  levothyroxine (SYNTHROID, LEVOTHROID) 88 MCG tablet Take 88 mcg by mouth daily.    [provider]  pantoprazole (PROTONIX) 40 MG tablet as directed. 02/07/19   [provider]  timolol (TIMOPTIC) 0.5 % ophthalmic solution 1 drop 2 (two) times daily. 03/05/19   [provider]    Physical Exam: Vitals:   06/15/19 1246  BP: (!) 146/94  Pulse: (!) 111  Resp: 18  Temp: 98.4 F (36.9 C)  TempSrc: Oral  SpO2: 99%      Constitutional: NAD, drowsy not communicative Vitals:   06/15/19 1246  BP: (!) 146/94  Pulse: (!) 111  Resp: 18  Temp: 98.4 F (36.9 C)  TempSrc: Oral  SpO2: 99%   Eyes: PERRL, lids and conjunctivae normal ENMT: Mucous membranes are dry. Posterior pharynx clear of any exudate or lesions.Normal dentition.  Neck: normal, supple, no masses, no thyromegaly Respiratory: clear to auscultation bilaterally, no wheezing, no crackles. Normal respiratory effort. No accessory muscle use.  Cardiovascular: Sinus tachycardia, no murmurs / rubs / gallops. No extremity edema. 2+ pedal pulses. No carotid bruits.  Abdomen: no tenderness, no masses palpated. No hepatosplenomegaly. Bowel sounds positive.  Musculoskeletal: no clubbing / cyanosis. No joint deformity upper and lower extremities. Good ROM, no contractures. Normal muscle tone.  Skin: no rashes, lesions, ulcers. No induration Neurologic: CN 2-12 grossly intact. Sensation intact, DTR normal. Strength 5/5 in all 4.  Psychiatric: Normal judgment and insight. Alert and oriented x 3. Normal mood.     Labs on Admission: I have personally reviewed following labs and imaging studies  CBC: Recent Labs  Lab 06/15/19 1350 06/15/19 1930  WBC 14.9*  --   HGB 13.5 13.9  HCT 43.9 41.0  MCV 82.7  --   PLT 277  --    Basic Metabolic Panel: Recent Labs  Lab 06/15/19 1350 06/15/19 1930  NA 146* 148*  K 4.8 4.6  CL 108  --   CO2 23  --   GLUCOSE 419*  --   BUN  40*  --   CREATININE 2.00*  --   CALCIUM 10.0  --    GFR: CrCl cannot be calculated (Unknown ideal weight.). Liver Function Tests: Recent Labs  Lab 06/15/19 1350  AST 17  ALT 20  ALKPHOS 154*  BILITOT 0.8  PROT 7.9  ALBUMIN 3.9   Recent Labs  Lab 06/15/19 1350  LIPASE 22   No results for input(s): AMMONIA in the last 168 hours. Coagulation Profile: No results for input(s): INR, PROTIME in the last 168 hours. Cardiac Enzymes: No results for input(s): CKTOTAL, CKMB, CKMBINDEX, TROPONINI in the last 168 hours. BNP (last 3 results) No results for input(s): PROBNP in the last 8760 hours. HbA1C: No results for input(s): HGBA1C in the last 72 hours. CBG: No results for input(s): GLUCAP in the last 168 hours. Lipid Profile: No results for  input(s): CHOL, HDL, LDLCALC, TRIG, CHOLHDL, LDLDIRECT in the last 72 hours. Thyroid Function Tests: No results for input(s): TSH, T4TOTAL, FREET4, T3FREE, THYROIDAB in the last 72 hours. Anemia Panel: No results for input(s): VITAMINB12, FOLATE, FERRITIN, TIBC, IRON, RETICCTPCT in the last 72 hours. Urine analysis:    Component Value Date/Time   COLORURINE YELLOW 06/15/2019 1654   APPEARANCEUR HAZY (A) 06/15/2019 1654   LABSPEC 1.013 06/15/2019 1654   PHURINE 5.0 06/15/2019 1654   GLUCOSEU >=500 (A) 06/15/2019 1654   HGBUR MODERATE (A) 06/15/2019 1654   BILIRUBINUR NEGATIVE 06/15/2019 1654   KETONESUR 5 (A) 06/15/2019 1654   PROTEINUR 100 (A) 06/15/2019 1654   NITRITE NEGATIVE 06/15/2019 1654   LEUKOCYTESUR MODERATE (A) 06/15/2019 1654   Sepsis Labs: @LABRCNTIP (procalcitonin:4,lacticidven:4) )No results found for this or any previous visit (from the past 240 hour(s)).   Radiological Exams on Admission: Ct Abdomen Pelvis Wo Contrast  Result Date: 06/15/2019 CLINICAL DATA:  Abdominal pain, epigastric pain EXAM: CT ABDOMEN AND PELVIS WITHOUT CONTRAST TECHNIQUE: Multidetector CT imaging of the abdomen and pelvis was performed  following the standard protocol without IV contrast. COMPARISON:  None. FINDINGS: Lower chest: Lung bases are clear. No effusions. Heart is normal size. Hepatobiliary: No focal liver abnormality is seen. Status post cholecystectomy. No biliary dilatation. Pancreas: No focal abnormality or ductal dilatation. Spleen: No focal abnormality.  Normal size. Adrenals/Urinary Tract: Gas within the urinary bladder, presumably from recent catheterization. No renal or adrenal mass. No hydronephrosis. Punctate nonobstructing stone in the mid to lower pole of the right kidney. Stomach/Bowel: Normal appendix. Stomach, large and small bowel grossly unremarkable. Vascular/Lymphatic: Aortic and branch vessel atherosclerosis. No aneurysm or adenopathy. Reproductive: Prior hysterectomy.  No adnexal masses. Other: No free fluid or free air. Musculoskeletal: No acute bony abnormality. IMPRESSION: No acute findings in the abdomen or pelvis. Aortic atherosclerosis. Gas in the urinary bladder, presumably from recent catheterization. Electronically Signed   By: Rolm Baptise M.D.   On: 06/15/2019 19:41    Assessment/Plan Principal Problem:   Nausea and/or vomiting Active Problems:   Diabetes mellitus ([place])   Hyperglycemia   Dehydration   ARF (acute renal failure) ([facility])     #1  Intractable nausea and vomiting: Symptomatic treatment.  Patient has had anorexia as well.  Continue to encourage diet.  No DKA at this point.  May be viral illness.  #2  Hyperglycemia: Patient's gap is 15.  She does not appear to be in DKA at the moment.  We will aggressively hydrate.  Sliding scale insulin.  Resume home insulin regiment.  #3 dehydration: Continue IV fluids.  #4 acute kidney injury: Appears to be prerenal.  Aggressively hydrate and monitor  #5 generalized weakness: May require PT and OT consults  #6 hypernatremia: May be related to significant dehydration.  Aggressively hydrate initially with normal saline then transition to  half-normal saline.  #7 diabetes: Sliding scale insulin.  Treat as per #2   DVT prophylaxis: Heparin Code Status: Full code Family Communication: Husband at bedside Disposition Plan: Home Consults called: None Admission status: Inpatient  Severity of Illness: The appropriate patient status for this patient is INPATIENT. Inpatient status is judged to be reasonable and necessary in order to provide the required intensity of service to ensure the patient's safety. The patient's presenting symptoms, physical exam findings, and initial radiographic and laboratory data in the context of their chronic comorbidities is felt to place them at high risk for further clinical deterioration. Furthermore, it is not anticipated that  the patient will be medically stable for discharge from the hospital within 2 midnights of admission. The following factors support the patient status of inpatient.   " The patient's presenting symptoms include nausea vomiting and weakness. " The worrisome physical exam findings include confusion and weakness. " The initial radiographic and laboratory data are worrisome because of creatinine 2.0.  " The chronic co-morbidities include diabetes.   * I certify that at the point of admission it is my clinical judgment that the patient will require inpatient hospital care spanning beyond 2 midnights from the point of admission due to high intensity of service, high risk for further deterioration and high frequency of surveillance required.Barbette Merino MD [facility] Pager [phone]  If 7PM-7AM, please contact night-coverage [email or web address] Password [facility]  06/15/2019, 8:26 PM

## 2019-06-15 NOTE — ED Notes (Signed)
Urine culture sent down to lab with urine sample 

## 2019-06-15 NOTE — ED Notes (Signed)
CT called and said that there is 2 patients in front of her for scans.

## 2019-06-15 NOTE — ED Provider Notes (Signed)
[facility] EMERGENCY DEPARTMENT Provider Note   CSN: [id number] Arrival date & time: 06/15/19  1240     History   Chief Complaint Chief Complaint  Patient presents with   Abdominal Pain    HPI Suzanne Pierce is a 63 y.o. female.     HPI  Pt is a 63 y/o female with a h/o DM, HLD, HTN, IBS, kidney stones, who presents to the ED today for eval of abd pain, NV. Pt states that she has had epigastric abd pain since June 2020 but it seems to have worsened. She denies any diarrhea.  Today, patient had appointment with PCP however before she was evaluated they felt she was not appropriate for clinic visit and advised her to go to the emergency department.  She was then seen at the [facility] ED and was diagnosed with dehydration and DKA.  She was treated in the emergency department and subsequently had improvement of her blood sugars and was discharged this morning and instructed to follow-up with PCP.  She states during her ED visit she was told she had a UTI but was not given antibiotics or discharge on any antibiotics.  She has had some urinary frequency but no dysuria.  She denies any chest pain, shortness of breath, cough, fevers.  Past Medical History:  Diagnosis Date   Diabetes ([place])    Family history of colon cancer    GERD (gastroesophageal reflux disease)    History of colon polyps    Hyperlipidemia    Hypertension    Hypothyroidism    Iron deficiency anemia    Irritable bowel syndrome (IBS)    with diarrhea   Kidney stones    Trigger index finger of right hand     Patient Active Problem List   Diagnosis Date Noted   Nausea and/or vomiting 06/15/2019   Hyperglycemia 06/15/2019   Diabetes mellitus ([place]) 12/09/2011   Overweight(278.02) 12/09/2011   S/P hysterectomy 12/09/2011   Vitiligo 12/09/2011    Past Surgical History:  Procedure Laterality Date   COLONOSCOPY  12/19/2015   Mild sigmoid diverticulosis. Small internal  hemorrhoids   ESOPHAGOGASTRODUODENOSCOPY  10/31/2015   Normal EGD   LITHOTRIPSY  01/2014   PARTIAL HYSTERECTOMY  2002     OB History   No obstetric history on file.      Home Medications    Prior to Admission medications   Medication Sig Start Date End Date Taking? Authorizing Provider  amLODipine (NORVASC) 5 MG tablet Take 5 mg by mouth daily.    [provider]  atorvastatin (LIPITOR) 40 MG tablet Take 40 mg by mouth daily.    [provider]  BD PEN NEEDLE NANO 2ND GEN 32G X 4 MM MISC as directed. use as directed 03/15/19   [provider]  Blood Glucose Monitoring Suppl (FIFTY50 GLUCOSE METER 2.0) w/Device KIT as directed. 11/22/18   [provider]  cholestyramine (QUESTRAN) 4 g packet Take once daily 2 hours before or after rest of medications. Patient not taking: Reported on 04/01/2019 11/09/18   Jackquline Denmark, MD  Difenoxin-Atropine (MOTOFEN) 1-0.025 MG TABS Take 1 tablet by mouth every 8 (eight) hours as needed. Patient not taking: Reported on 04/29/2019 04/01/19   Jackquline Denmark, MD  esomeprazole (NEXIUM) 40 MG capsule Take 40 mg by mouth daily.    [provider]  FERROUS SULFATE PO Take 1 tablet by mouth daily.    [provider]  fosinopril (MONOPRIL) 20 MG tablet Take  20 mg by mouth daily.    [provider]  gabapentin (NEURONTIN) 300 MG capsule 1 capsule as directed. Take every 6 ours and 2 at bedtime 03/10/19   [provider]  glimepiride (AMARYL) 4 MG tablet Take 4 mg by mouth daily.    [provider]  insulin detemir (LEVEMIR) 100 UNIT/ML injection Inject into the skin daily.    [provider]  levothyroxine (SYNTHROID, LEVOTHROID) 88 MCG tablet Take 88 mcg by mouth daily.    [provider]  pantoprazole (PROTONIX) 40 MG tablet as directed. 02/07/19   [provider]  timolol (TIMOPTIC) 0.5 % ophthalmic solution 1 drop 2 (two) times daily. 03/05/19   [provider]    Family History Family History  Problem Relation Age of Onset   Colon cancer Father    Rectal cancer Neg Hx     Social History Social History   Tobacco Use   Smoking status: Never Smoker   Smokeless tobacco: Never Used  Substance Use Topics   Alcohol use: No   Drug use: No     Allergies   Penicillins   Review of Systems Review of Systems  Constitutional: Negative for fever.  HENT: Negative for ear pain and sore throat.   Eyes: Negative for visual disturbance.  Respiratory: Negative for cough and shortness of breath.   Cardiovascular: Negative for chest pain.  Gastrointestinal: Positive for abdominal pain, nausea and vomiting.  Genitourinary: Positive for frequency. Negative for dysuria and hematuria.  Musculoskeletal: Negative for back pain.  Skin: Negative for rash.  Neurological: Negative for headaches.  All other systems reviewed and are negative.    Physical Exam Updated Vital Signs BP (!) 146/94 (BP Location: Right Arm)    Pulse (!) 111    Temp 98.4 F (36.9 C) (Oral)    Resp 18    SpO2 99%   Physical Exam Vitals signs and nursing note reviewed.  Constitutional:      General: She is not in acute distress.    Appearance: She is well-developed.  HENT:     Head: Normocephalic and atraumatic.  Eyes:     Conjunctiva/sclera: Conjunctivae normal.  Neck:     Musculoskeletal: Neck supple.  Cardiovascular:     Rate and Rhythm: Regular rhythm. Tachycardia present.     Heart sounds: No murmur.  Pulmonary:     Effort: Pulmonary effort is normal. No respiratory distress.     Breath sounds: Normal breath sounds. No wheezing, rhonchi or rales.  Abdominal:     Palpations: Abdomen is soft.     Tenderness: There is abdominal tenderness in the right upper quadrant, epigastric area, left upper quadrant and left lower quadrant. There is guarding. There is no rebound.  Skin:    General: Skin is warm and dry.  Neurological:     Mental Status:  She is alert.     ED Treatments / Results  Labs (all labs ordered are listed, but only abnormal results are displayed) Labs Reviewed  COMPREHENSIVE METABOLIC PANEL - Abnormal; Notable for the following components:      Result Value   Sodium 146 (*)    Glucose, Bld 419 (*)    BUN 40 (*)    Creatinine, Ser 2.00 (*)    Alkaline Phosphatase 154 (*)    GFR calc non Af Amer 26 (*)    GFR calc Af Amer 30 (*)    All other components within normal limits  CBC - Abnormal; Notable  for the following components:   WBC 14.9 (*)    RBC 5.31 (*)    MCH 25.4 (*)    All other components within normal limits  URINALYSIS, ROUTINE W REFLEX MICROSCOPIC - Abnormal; Notable for the following components:   APPearance HAZY (*)    Glucose, UA >=500 (*)    Hgb urine dipstick MODERATE (*)    Ketones, ur 5 (*)    Protein, ur 100 (*)    Leukocytes,Ua MODERATE (*)    WBC, UA >50 (*)    Bacteria, UA FEW (*)    All other components within normal limits  LACTIC ACID, PLASMA - Abnormal; Notable for the following components:   Lactic Acid, Venous 2.3 (*)    All other components within normal limits  POCT I-STAT EG7 - Abnormal; Notable for the following components:   pCO2, Ven 37.1 (*)    pO2, Ven 29.0 (*)    Sodium 148 (*)    All other components within normal limits  URINE CULTURE  LIPASE, BLOOD  BLOOD GAS, VENOUS  LACTIC ACID, PLASMA  CBG MONITORING, ED    EKG EKG Interpretation  Date/Time:  Wednesday June 15 2019 12:56:57 EST Ventricular Rate:  116 PR Interval:  146 QRS Duration: 64 QT Interval:  296 QTC Calculation: 411 R Axis:   43 Text Interpretation: Sinus tachycardia Right atrial enlargement ST & T wave abnormality, consider inferolateral ischemia Abnormal ECG No old tracing to compare Confirmed by Lacretia Leigh ([id number]) on 06/15/2019 5:47:38 PM   Radiology Ct Abdomen Pelvis Wo Contrast  Result Date: 06/15/2019 CLINICAL DATA:  Abdominal pain, epigastric pain EXAM: CT ABDOMEN AND  PELVIS WITHOUT CONTRAST TECHNIQUE: Multidetector CT imaging of the abdomen and pelvis was performed following the standard protocol without IV contrast. COMPARISON:  None. FINDINGS: Lower chest: Lung bases are clear. No effusions. Heart is normal size. Hepatobiliary: No focal liver abnormality is seen. Status post cholecystectomy. No biliary dilatation. Pancreas: No focal abnormality or ductal dilatation. Spleen: No focal abnormality.  Normal size. Adrenals/Urinary Tract: Gas within the urinary bladder, presumably from recent catheterization. No renal or adrenal mass. No hydronephrosis. Punctate nonobstructing stone in the mid to lower pole of the right kidney. Stomach/Bowel: Normal appendix. Stomach, large and small bowel grossly unremarkable. Vascular/Lymphatic: Aortic and branch vessel atherosclerosis. No aneurysm or adenopathy. Reproductive: Prior hysterectomy.  No adnexal masses. Other: No free fluid or free air. Musculoskeletal: No acute bony abnormality. IMPRESSION: No acute findings in the abdomen or pelvis. Aortic atherosclerosis. Gas in the urinary bladder, presumably from recent catheterization. Electronically Signed   By: Rolm Baptise M.D.   On: 06/15/2019 19:41    Procedures Procedures (including critical care time)  Medications Ordered in ED Medications  sodium chloride flush (NS) 0.9 % injection 3 mL (has no administration in time range)  famotidine (PEPCID) IVPB 20 mg premix (has no administration in time range)  pantoprazole (PROTONIX) injection 40 mg (has no administration in time range)  ondansetron (ZOFRAN) injection 4 mg (has no administration in time range)  morphine 4 MG/ML injection 4 mg (has no administration in time range)  cefTRIAXone (ROCEPHIN) 1 g in sodium chloride 0.9 % 100 mL IVPB (has no administration in time range)  insulin aspart (novoLOG) injection 8 Units (has no administration in time range)  sodium chloride 0.9 % bolus 1,000 mL (1,000 mLs Intravenous New  Bag/Given 06/15/19 2011)  sodium chloride 0.9 % bolus 1,000 mL (1,000 mLs Intravenous New Bag/Given 06/15/19 2012)     Initial  Impression / Assessment and Plan / ED Course  I have reviewed the triage vital signs and the nursing notes.  Pertinent labs & imaging results that were available during my care of the patient were reviewed by me and considered in my medical decision making (see chart for details).   Final Clinical Impressions(s) / ED Diagnoses   Final diagnoses:  None   63 year old female presenting for evaluation of epigastric abdominal pain, nausea and vomiting.  Seen at [facility] ED yesterday and diagnosed with dehydration DKA and was subsequently discharged this morning to follow-up with PCP who sent her again to the ED because her symptoms were persisting.  On arrival here, she is afebrile, tachycardic.  Blood pressure slightly elevated.  CBC reveals leukocytosis of 15,000, no anemia CMP with hypernatremia (corrected sodium of 154) elevated blood glucose at 419, elevated BUN/creatinine at 40/2 which is increased from yesterday (1.5 at [facility]).  Patient with normal bicarb and no elevated anion gap. Lipase WNL UA suggestive of UTI, ceftriaxone given. Culture sent.  Ph wnl Lactic acid initially elevated  CT abd/pelvis  63 year old female with history of diabetes with recent visit to the ED yesterday for evaluation of DKA.  Gap closed while in the ED but after discharge had continuation of her symptoms with vomiting.  Presents today very dehydrated and ill-appearing.  She does not appear to be in DKA today however given her significant evidence of dehydration and persistent vomiting in setting of UTI, will plan for admission to the hospitalist service.  8:21 PM CONSULT with Dr. Jonelle Sidle with hospitalist service who accepts patient for admission.   ED Discharge Orders    None       Bishop Dublin 06/15/19 2021    Lacretia Leigh, MD 06/16/19 1324

## 2019-06-15 NOTE — ED Triage Notes (Signed)
C/ sharp mid upper abd pain since Monday with nausea.  Seen at [facility] last night for DKA and dehydration.

## 2019-06-16 ENCOUNTER — Encounter (HOSPITAL_COMMUNITY): Payer: Self-pay

## 2019-06-16 DIAGNOSIS — R739 Hyperglycemia, unspecified: Secondary | ICD-10-CM

## 2019-06-16 DIAGNOSIS — R1013 Epigastric pain: Secondary | ICD-10-CM

## 2019-06-16 DIAGNOSIS — K30 Functional dyspepsia: Secondary | ICD-10-CM | POA: Diagnosis present

## 2019-06-16 DIAGNOSIS — E860 Dehydration: Secondary | ICD-10-CM | POA: Diagnosis present

## 2019-06-16 DIAGNOSIS — R112 Nausea with vomiting, unspecified: Secondary | ICD-10-CM

## 2019-06-16 DIAGNOSIS — N179 Acute kidney failure, unspecified: Secondary | ICD-10-CM | POA: Diagnosis present

## 2019-06-16 DIAGNOSIS — E1122 Type 2 diabetes mellitus with diabetic chronic kidney disease: Secondary | ICD-10-CM | POA: Diagnosis present

## 2019-06-16 DIAGNOSIS — R350 Frequency of micturition: Secondary | ICD-10-CM | POA: Diagnosis present

## 2019-06-16 DIAGNOSIS — D509 Iron deficiency anemia, unspecified: Secondary | ICD-10-CM | POA: Diagnosis present

## 2019-06-16 DIAGNOSIS — E039 Hypothyroidism, unspecified: Secondary | ICD-10-CM | POA: Diagnosis present

## 2019-06-16 DIAGNOSIS — K2970 Gastritis, unspecified, without bleeding: Secondary | ICD-10-CM | POA: Diagnosis present

## 2019-06-16 DIAGNOSIS — E1165 Type 2 diabetes mellitus with hyperglycemia: Secondary | ICD-10-CM | POA: Diagnosis present

## 2019-06-16 DIAGNOSIS — N170 Acute kidney failure with tubular necrosis: Secondary | ICD-10-CM | POA: Diagnosis not present

## 2019-06-16 DIAGNOSIS — E785 Hyperlipidemia, unspecified: Secondary | ICD-10-CM

## 2019-06-16 DIAGNOSIS — G8929 Other chronic pain: Secondary | ICD-10-CM

## 2019-06-16 DIAGNOSIS — E1169 Type 2 diabetes mellitus with other specified complication: Secondary | ICD-10-CM | POA: Diagnosis present

## 2019-06-16 DIAGNOSIS — I129 Hypertensive chronic kidney disease with stage 1 through stage 4 chronic kidney disease, or unspecified chronic kidney disease: Secondary | ICD-10-CM | POA: Diagnosis present

## 2019-06-16 DIAGNOSIS — E870 Hyperosmolality and hypernatremia: Secondary | ICD-10-CM | POA: Diagnosis present

## 2019-06-16 DIAGNOSIS — R630 Anorexia: Secondary | ICD-10-CM | POA: Diagnosis present

## 2019-06-16 DIAGNOSIS — Z880 Allergy status to penicillin: Secondary | ICD-10-CM | POA: Diagnosis not present

## 2019-06-16 DIAGNOSIS — N200 Calculus of kidney: Secondary | ICD-10-CM | POA: Diagnosis present

## 2019-06-16 DIAGNOSIS — Z8249 Family history of ischemic heart disease and other diseases of the circulatory system: Secondary | ICD-10-CM | POA: Diagnosis not present

## 2019-06-16 DIAGNOSIS — K2100 Gastro-esophageal reflux disease with esophagitis, without bleeding: Secondary | ICD-10-CM | POA: Diagnosis present

## 2019-06-16 DIAGNOSIS — Z20828 Contact with and (suspected) exposure to other viral communicable diseases: Secondary | ICD-10-CM | POA: Diagnosis present

## 2019-06-16 DIAGNOSIS — N1832 Chronic kidney disease, stage 3b: Secondary | ICD-10-CM | POA: Diagnosis present

## 2019-06-16 DIAGNOSIS — K209 Esophagitis, unspecified without bleeding: Secondary | ICD-10-CM | POA: Diagnosis not present

## 2019-06-16 DIAGNOSIS — Z794 Long term (current) use of insulin: Secondary | ICD-10-CM | POA: Diagnosis not present

## 2019-06-16 DIAGNOSIS — K3189 Other diseases of stomach and duodenum: Secondary | ICD-10-CM | POA: Diagnosis present

## 2019-06-16 LAB — CBC
HCT: 37.6 % (ref 36.0–46.0)
Hemoglobin: 11.1 g/dL — ABNORMAL LOW (ref 12.0–15.0)
MCH: 24.9 pg — ABNORMAL LOW (ref 26.0–34.0)
MCHC: 29.5 g/dL — ABNORMAL LOW (ref 30.0–36.0)
MCV: 84.5 fL (ref 80.0–100.0)
Platelets: 178 10*3/uL (ref 150–400)
RBC: 4.45 MIL/uL (ref 3.87–5.11)
RDW: 14.1 % (ref 11.5–15.5)
WBC: 13.2 10*3/uL — ABNORMAL HIGH (ref 4.0–10.5)
nRBC: 0 % (ref 0.0–0.2)

## 2019-06-16 LAB — COMPREHENSIVE METABOLIC PANEL
ALT: 15 U/L (ref 0–44)
AST: 14 U/L — ABNORMAL LOW (ref 15–41)
Albumin: 3.3 g/dL — ABNORMAL LOW (ref 3.5–5.0)
Alkaline Phosphatase: 125 U/L (ref 38–126)
Anion gap: 11 (ref 5–15)
BUN: 38 mg/dL — ABNORMAL HIGH (ref 8–23)
CO2: 19 mmol/L — ABNORMAL LOW (ref 22–32)
Calcium: 8.9 mg/dL (ref 8.9–10.3)
Chloride: 119 mmol/L — ABNORMAL HIGH (ref 98–111)
Creatinine, Ser: 1.69 mg/dL — ABNORMAL HIGH (ref 0.44–1.00)
GFR calc Af Amer: 37 mL/min — ABNORMAL LOW (ref >60)
GFR calc non Af Amer: 32 mL/min — ABNORMAL LOW (ref >60)
Glucose, Bld: 316 mg/dL — ABNORMAL HIGH (ref 70–99)
Potassium: 4.3 mmol/L (ref 3.5–5.1)
Sodium: 149 mmol/L — ABNORMAL HIGH (ref 135–145)
Total Bilirubin: 0.5 mg/dL (ref 0.3–1.2)
Total Protein: 6.6 g/dL (ref 6.5–8.1)

## 2019-06-16 LAB — URINE CULTURE: Culture: NO GROWTH

## 2019-06-16 LAB — SARS CORONAVIRUS 2 (TAT 6-24 HRS): SARS Coronavirus 2: NEGATIVE

## 2019-06-16 LAB — GLUCOSE, CAPILLARY
Glucose-Capillary: 386 mg/dL — ABNORMAL HIGH (ref 70–99)
Glucose-Capillary: 297 mg/dL — ABNORMAL HIGH (ref 70–99)
Glucose-Capillary: 170 mg/dL — ABNORMAL HIGH (ref 70–99)
Glucose-Capillary: 172 mg/dL — ABNORMAL HIGH (ref 70–99)
Glucose-Capillary: 142 mg/dL — ABNORMAL HIGH (ref 70–99)

## 2019-06-16 MED ORDER — SODIUM CHLORIDE 0.45 % IV SOLN
INTRAVENOUS | Status: DC
  Administered 2019-06-16 (×2): via INTRAVENOUS

## 2019-06-16 MED ORDER — INSULIN DETEMIR 100 UNIT/ML ~~LOC~~ SOLN
20.0000 [IU] | Freq: Two times a day (BID) | SUBCUTANEOUS | Status: DC
  Administered 2019-06-16 (×2): 20 [IU] via SUBCUTANEOUS
  Filled 2019-06-16 (×5): qty 0.2

## 2019-06-16 MED ORDER — AMLODIPINE BESYLATE 10 MG PO TABS
10.0000 mg | ORAL_TABLET | Freq: Every day | ORAL | Status: DC
  Administered 2019-06-16 – 2019-06-19 (×4): 10 mg via ORAL
  Filled 2019-06-16 (×4): qty 1

## 2019-06-16 MED ORDER — FAMOTIDINE IN NACL 20-0.9 MG/50ML-% IV SOLN
20.0000 mg | Freq: Two times a day (BID) | INTRAVENOUS | Status: DC
  Administered 2019-06-16 (×2): 20 mg via INTRAVENOUS
  Filled 2019-06-16 (×3): qty 50

## 2019-06-16 MED ORDER — PANTOPRAZOLE SODIUM 40 MG PO TBEC
40.0000 mg | DELAYED_RELEASE_TABLET | Freq: Every day | ORAL | Status: DC
  Administered 2019-06-16 – 2019-06-18 (×3): 40 mg via ORAL
  Filled 2019-06-16 (×3): qty 1

## 2019-06-16 NOTE — Progress Notes (Signed)
Inpatient Diabetes Program Recommendations  AACE/ADA: New Consensus Statement on Inpatient Glycemic Control (2015)  Target Ranges:  Prepandial:   less than 140 mg/dL      Peak postprandial:   less than 180 mg/dL (1-2 hours)      Critically ill patients:  140 - 180 mg/dL   Lab Results  Component Value Date   GLUCAP 170 (H) 06/16/2019   HGBA1C 11.5 (H) 06/15/2019    Review of Glycemic Control  Diabetes history: DM2 Outpatient Diabetes medications: Levemir 30 units in am and 22 units QHS Current orders for Inpatient glycemic control: Levemir 20 units bid, Novolog 0-20 units tidwc and HS.  HgbA1C - 11.5% - uncontrolled  Inpatient Diabetes Program Recommendations:     Add Novolog 4 units tidwc for meal coverage insulin   Spoke with pt at length regarding her diabetes control and HgbA1C of 11.5%. Pt states she takes Levemir as prescribed, but feels she needs a rapid-acting insulin to cover her meals. In looking at EMR, it appears Novolin R has been ordered for QID, but do not see amount. Will make sure pt has prescription for short-acting insulin at discharge. Sees MD every 3 months. States blood sugars run in 200s, sometimes higher. Is very motivated to feel better and get blood sugars in control.  Appreciative of phone call.   Please make sure pt has Novolin (ReliOn) R insulin prescription at discharge.  Continue to follow.  Thank you. Lorenda Peck, RD, LDN, CDE Inpatient Diabetes Coordinator [phone]

## 2019-06-16 NOTE — Progress Notes (Signed)
PROGRESS NOTE    Suzanne Pierce  [id number] DOB: 1955-09-18 DOA: 06/15/2019 PCP: Myrlene Broker, MD    Brief Narrative:  63 year old with history of insulin-dependent diabetes, hypertension, hyperlipidemia, hypothyroidism and GERD and currently not on any acid suppression medications, history of colonic polyp, cholecystectomy presented to the emergency room with persistent nausea vomiting and abdominal discomfort.  She was seen at [facility] emergency room for elevated blood sugars and treated with IV fluids and insulin discharged home, seen by her primary care physician with persistent pain and was advised to go to [facility], ER.  Patient suffering from about 5 days of persistent epigastric pain, nausea and vomiting on attempt to eat, initially had some diarrhea but no bowel movement for last 2 days.  In the emergency room afebrile, blood pressure stable, tachycardic, on room air.  Creatinine 2.08.  Lactic acid 2.2.  WBC count 13.7.  Sodium 146.  CT scan abdomen pelvis within normal limits.   Assessment & Plan:   Principal Problem:   Nausea and/or vomiting Active Problems:   Diabetes mellitus ([place])   Hyperglycemia   Dehydration   ARF (acute renal failure) (H[facility])   Type 2 diabetes mellitus with hyperlipidemia ([facility])  Intractable nausea vomiting, epigastric pain in a patient with chronic epigastric pain, chronic dyspepsia: Previous upper GI endoscopy was negative as per reports.  Recent colonoscopy was normal with polyp. Patient not on any acid suppression medications. She has significant symptoms and multiple ER visits with similar symptoms. Continue IV fluids, nausea medications, will start on Protonix. Called and discussed with gastroenterology, due to significant symptoms she will benefit with upper GI evaluation.  Insulin-dependent type 2 diabetes with hyperglycemia: Probably aggravated by #1.  Blood sugars are elevated.patient able to take some liquid diet. Resume long  acting insulin twice a day. Iv fluids. SSI. May need to uptitrate insulin. A1C is 11.   Dehydration with hypernatremia with acute kidney injury: Patient continues to have hypernatremia.  She received resuscitation with isotonic saline.  Currently adequately resuscitated.  Changed to half-normal saline and monitor levels.  Presumed UTI: She does have symptoms.  On Rocephin.  Continue until final cultures.  DVT prophylaxis: Heparin subcu Code Status: Full code Family Communication: None Disposition Plan: Inpatient. Patient has significant symptoms, hyperglycemia, nausea vomiting and unable to tolerate diet, significant electrolyte abnormalities including hypernatremia.  She will need inpatient hospitalization and treatment.   Consultants:   Gastroenterology  Procedures:   None  Antimicrobials:   Rocephin, 06/15/2019   Subjective: Patient seen and examined.  Denies any vomiting but does not have any appetite.  Persistent upper epigastric pain and discomfort.  Afebrile.  Objective: Vitals:   06/16/19 0230 06/16/19 0400 06/16/19 0553 06/16/19 0631  BP: (!) 145/93 (!) 169/103 (!) 184/106   Pulse: (!) 107   (!) 117  Resp: 11 14 16    Temp:   98.6 F (37 C)   TempSrc:   Oral   SpO2: 94%   100%  Weight:   65.9 kg   Height:   5\' 3"  (1.6 m)     Intake/Output Summary (Last 24 hours) at 06/16/2019 1333 Last data filed at 06/15/2019 2245 Gross per 24 hour  Intake 200 ml  Output -  Net 200 ml   Filed Weights   06/16/19 0553  Weight: 65.9 kg    Examination:  General exam: Appears calm and comfortable, on room air Respiratory system: Clear to auscultation. Respiratory effort normal. Cardiovascular system: S1 & S2 heard, RRR.  No JVD, murmurs, rubs, gallops or clicks. No pedal edema. Gastrointestinal system: Abdomen is nondistended, soft and mild tenderness in the epigastrium.  No organomegaly or masses felt. Normal bowel sounds heard. Central nervous system: Alert and oriented.  No focal neurological deficits. Extremities: Symmetric 5 x 5 power. Skin: No rashes, lesions or ulcers Psychiatry: Judgement and insight appear normal. Mood & affect appropriate.     Data Reviewed: I have personally reviewed following labs and imaging studies  CBC: Recent Labs  Lab 06/15/19 1350 06/15/19 1930 06/15/19 2135 06/16/19 0427  WBC 14.9*  --  13.6* 13.2*  HGB 13.5 13.9 12.3 11.1*  HCT 43.9 41.0 40.0 37.6  MCV 82.7  --  83.0 84.5  PLT 277  --  233 0000000   Basic Metabolic Panel: Recent Labs  Lab 06/15/19 1350 06/15/19 1930 06/15/19 2135 06/16/19 0427  NA 146* 148*  --  149*  K 4.8 4.6  --  4.3  CL 108  --   --  119*  CO2 23  --   --  19*  GLUCOSE 419*  --   --  316*  BUN 40*  --   --  38*  CREATININE 2.00*  --  2.08* 1.69*  CALCIUM 10.0  --   --  8.9   GFR: Estimated Creatinine Clearance: 31.9 mL/min (A) (by C-G formula based on SCr of 1.69 mg/dL (H)). Liver Function Tests: Recent Labs  Lab 06/15/19 1350 06/16/19 0427  AST 17 14*  ALT 20 15  ALKPHOS 154* 125  BILITOT 0.8 0.5  PROT 7.9 6.6  ALBUMIN 3.9 3.3*   Recent Labs  Lab 06/15/19 1350  LIPASE 22   No results for input(s): AMMONIA in the last 168 hours. Coagulation Profile: No results for input(s): INR, PROTIME in the last 168 hours. Cardiac Enzymes: No results for input(s): CKTOTAL, CKMB, CKMBINDEX, TROPONINI in the last 168 hours. BNP (last 3 results) No results for input(s): PROBNP in the last 8760 hours. HbA1C: Recent Labs    06/15/19 2135  HGBA1C 11.5*   CBG: Recent Labs  Lab 06/15/19 1918 06/15/19 2207 06/16/19 0632 06/16/19 1143  GLUCAP 386* 279* 297* 170*   Lipid Profile: No results for input(s): CHOL, HDL, LDLCALC, TRIG, CHOLHDL, LDLDIRECT in the last 72 hours. Thyroid Function Tests: No results for input(s): TSH, T4TOTAL, FREET4, T3FREE, THYROIDAB in the last 72 hours. Anemia Panel: No results for input(s): VITAMINB12, FOLATE, FERRITIN, TIBC, IRON, RETICCTPCT in  the last 72 hours. Sepsis Labs: Recent Labs  Lab 06/15/19 1920 06/15/19 2135  LATICACIDVEN 2.3* 2.2*    Recent Results (from the past 240 hour(s))  SARS CORONAVIRUS 2 (TAT 6-24 HRS) Nasopharyngeal Nasopharyngeal Swab     Status: None   Collection Time: 06/16/19  8:47 AM   Specimen: Nasopharyngeal Swab  Result Value Ref Range Status   SARS Coronavirus 2 NEGATIVE NEGATIVE Final    Comment: (NOTE) SARS-CoV-2 target nucleic acids are NOT DETECTED. The SARS-CoV-2 RNA is generally detectable in upper and lower respiratory specimens during the acute phase of infection. Negative results do not preclude SARS-CoV-2 infection, do not rule out co-infections with other pathogens, and should not be used as the sole basis for treatment or other patient management decisions. Negative results must be combined with clinical observations, patient history, and epidemiological information. The expected result is Negative. Fact Sheet for Patients: SugarRoll.be Fact Sheet for Healthcare Providers: https://www.woods-mathews.com/ This test is not yet approved or cleared by the [place] FDA and  has been  authorized for detection and/or diagnosis of SARS-CoV-2 by FDA under an Emergency Use Authorization (EUA). This EUA will remain  in effect (meaning this test can be used) for the duration of the COVID-19 declaration under Section 56 4(b)(1) of the Act, 21 U.S.C. section 360bbb-3(b)(1), unless the authorization is terminated or revoked sooner. Performed at [facility], [place] [address], [place], [place] [address]          Radiology Studies: Ct Abdomen Pelvis Wo Contrast  Result Date: 06/15/2019 CLINICAL DATA:  Abdominal pain, epigastric pain EXAM: CT ABDOMEN AND PELVIS WITHOUT CONTRAST TECHNIQUE: Multidetector CT imaging of the abdomen and pelvis was performed following the standard protocol without IV contrast. COMPARISON:  None. FINDINGS:  Lower chest: Lung bases are clear. No effusions. Heart is normal size. Hepatobiliary: No focal liver abnormality is seen. Status post cholecystectomy. No biliary dilatation. Pancreas: No focal abnormality or ductal dilatation. Spleen: No focal abnormality.  Normal size. Adrenals/Urinary Tract: Gas within the urinary bladder, presumably from recent catheterization. No renal or adrenal mass. No hydronephrosis. Punctate nonobstructing stone in the mid to lower pole of the right kidney. Stomach/Bowel: Normal appendix. Stomach, large and small bowel grossly unremarkable. Vascular/Lymphatic: Aortic and branch vessel atherosclerosis. No aneurysm or adenopathy. Reproductive: Prior hysterectomy.  No adnexal masses. Other: No free fluid or free air. Musculoskeletal: No acute bony abnormality. IMPRESSION: No acute findings in the abdomen or pelvis. Aortic atherosclerosis. Gas in the urinary bladder, presumably from recent catheterization. Electronically Signed   By: Rolm Baptise M.D.   On: 06/15/2019 19:41        Scheduled Meds: . amLODipine  10 mg Oral Daily  . heparin  5,000 Units Subcutaneous Q8H  . insulin aspart  0-20 Units Subcutaneous TID WC  . insulin aspart  0-5 Units Subcutaneous QHS  . insulin detemir  20 Units Subcutaneous BID  . pantoprazole  40 mg Oral Q0600  . sodium chloride flush  3 mL Intravenous Once   Continuous Infusions: . sodium chloride 100 mL/hr at 06/16/19 0854  . cefTRIAXone (ROCEPHIN)  IV Stopped (06/15/19 2245)  . famotidine (PEPCID) IV 20 mg (06/16/19 1057)     LOS: 0 days    Time spent: 30 minutes    Barb Merino, MD [facility] Pager [phone]

## 2019-06-16 NOTE — H&P (View-Only) (Signed)
[facility] Consult: 10:39 AM 06/16/2019  LOS: 0 days    Referring Provider: Dr Barb Merino  Primary Care Physician:  Myrlene Broker, MD Primary Gastroenterologist:  Dr. Lyndel Safe    Reason for Consultation: Recurrent episodic epigastric pain.   HPI: Suzanne Pierce is a 63 y.o. female.  Poorly controlled IDDM.  CKD.  Hypothyroidism Previous cholecystectomy, BTL, hysterectomy. History of acute episodes of epigastric pain dating back many years EGDs 06/2010 and 10/2015.  Both normal studies.  Biopsies negative for H. pylori 06/2010 CTAP in 2015 negative Unclear if she has ever had gastric emptying study though it was mentioned as a possible study to be pursued in Dr. Steve Rattler office notes from [place]. 04/29/2019 colonoscopy.  Dr Lyndel Safe.  For chronic diarrhea.  The operative note mentions family history in her father of colon cancer at age 28 but this is her stepfather.  6 mm rectal polyp removed (TA).  Mild sigmoid diverticulosis.  Otherwise normal study to TI. Path from random colon biopsies: unremarkable/benign/normal. 12/14/2018 stool culture, stool WBC, stool Campylobacter, Shigella toxin negative.  Fecal lactoferrin positive. H. pylori breath test 10/25/2018 WNL. Dr. Lyndel Safe suspects IBS/D with element of postcholecystectomy diarrhea.  Has treated her with cholestyramine.  List of meds from [facility] includes Carafate 4 times daily Protonix 40 mg p.o./day.  Ferrous sulfate 325 mg TID. However she is not taking Carafate or PPI.  Referred to the ED by her PMD on 11/3 for 5 day history cough and 4 days epigastric pain anorexia, significantly decreased p.o. intake and nonbloody nausea, vomiting. GI symptoms very similar to episodes she has had dating back several years.  2 episodes led to  admissions to [facility], 1 in June, 1 in July of this year.  Because of the epigastric pain, she underwent cholecystectomy ~2017 at [facility] but this did not resolve the problem.  When she gets an attack of the symptoms, she goes to the hospital.  She does not try to ride this out at home.  Does not have access to antinausea ills and does not take any over-the-counter acid suppression or antacids.  Patient received IV fluids at the [facility] ED and was discharged.  Symptoms persisted and she returned to the [facility] ED yesterday 11/4  06/15/2019 CTAP wo contrast.  Unremarkable other than aortic/branch vessel atherosclerosis.  Gas in urinary bladder presumed due to recent catheterization.  Punctate, nonobstructing right kidney stone.  Prior cholecystectomy, hysterectomy. In mid March, Hb 9.5, normal MCV. Hgb currently 13.5 >> 11.1 following IV fluids. LFTs, lipase WNL.  Note previous elevations of alkaline phosphatase with otherwise normal LFTs.  Alk phos 12/2018: 195.  02/2019: 145.  06/01/19: 186 BUN/creat: 40/2 >> 38/1.6.  34/1.5 on 06/01/2019. Blood glucose 419.... 316.  IMA globin A1c 11.5. WBCs 14.9 >> 13.2.   COVID-19 testing pending.  Fm Hx Breast cancer in a sister, heart disease in her mother.  Hypertension in her father.  Diabetes in both parents.    Social Hx  No alcohol, no cigarettes.  Past Medical History:  Diagnosis Date  . Diabetes ([place])   . Family history of colon cancer   . GERD (gastroesophageal reflux disease)   . History of colon polyps   . Hyperlipidemia   . Hypertension   . Hypothyroidism   . Iron deficiency anemia   . Irritable bowel syndrome (IBS)    with diarrhea  . Kidney stones   . Trigger index finger of right hand     Past Surgical History:  Procedure Laterality Date  . COLONOSCOPY  12/19/2015   Mild sigmoid diverticulosis. Small internal hemorrhoids  . ESOPHAGOGASTRODUODENOSCOPY  10/31/2015   Normal EGD  . LITHOTRIPSY  01/2014  .  PARTIAL HYSTERECTOMY  2002    Prior to Admission medications   Medication Sig Start Date End Date Taking? Authorizing Provider  acetaminophen (TYLENOL) 500 MG tablet Take 500-1,000 mg by mouth every 6 (six) hours as needed for mild pain or headache.   Yes [provider]  amLODipine (NORVASC) 10 MG tablet Take 10 mg by mouth daily.   Yes [provider]  ferrous sulfate 325 (65 FE) MG tablet Take 325 mg by mouth daily with breakfast.   Yes [provider]  gabapentin (NEURONTIN) 300 MG capsule Take 300-600 mg by mouth See admin instructions. Take 300 mg by mouth in the morning, 300 mg at 2 PM, and 600 mg at bedtime 03/10/19  Yes [provider]  Insulin Detemir (LEVEMIR FLEXTOUCH) 100 UNIT/ML Pen Inject 22-30 Units into the skin See admin instructions. Inject 30 units into the skin in the morning before breakfast and 22 units at bedtime   Yes [provider]  latanoprost (XALATAN) 0.005 % ophthalmic solution Place 1 drop into both eyes 2 (two) times daily.   Yes [provider]  levothyroxine (SYNTHROID, LEVOTHROID) 88 MCG tablet Take 88 mcg by mouth daily.   Yes [provider]  methocarbamol (ROBAXIN) 500 MG tablet Take 500 mg by mouth every 6 (six) hours as needed for muscle spasms.   Yes [provider]  timolol (TIMOPTIC) 0.5 % ophthalmic solution Place 1 drop into both eyes 2 (two) times daily.  03/05/19  Yes [provider]  BD PEN NEEDLE NANO 2ND GEN 32G X 4 MM MISC as directed. use as directed 03/15/19   [provider]  Blood Glucose Monitoring Suppl (FIFTY50 GLUCOSE METER 2.0) w/Device KIT as directed. 11/22/18   [provider]  cholestyramine (QUESTRAN) 4 g packet Take once daily 2 hours before or after rest of medications. Patient not taking: Reported on 06/15/2019 11/09/18   Jackquline Denmark, MD  Difenoxin-Atropine (MOTOFEN) 1-0.025 MG TABS Take 1 tablet by mouth every 8 (eight) hours as needed.  Patient not taking: Reported on 06/15/2019 04/01/19   Jackquline Denmark, MD  meloxicam (MOBIC) 7.5 MG tablet Take 7.5 mg by mouth daily.    [provider]    Scheduled Meds: . amLODipine  10 mg Oral Daily  . heparin  5,000 Units Subcutaneous Q8H  . insulin aspart  0-20 Units Subcutaneous TID WC  . insulin aspart  0-5 Units Subcutaneous QHS  . insulin detemir  20 Units Subcutaneous BID  . sodium chloride flush  3 mL Intravenous Once   Infusions: . sodium chloride 100 mL/hr at 06/16/19 0854  . cefTRIAXone (ROCEPHIN)  IV Stopped (06/15/19 2245)  . famotidine (PEPCID) IV     PRN Meds: acetaminophen **OR** acetaminophen, ondansetron **OR** ondansetron (ZOFRAN) IV   Allergies as of 06/15/2019 - Review Complete 06/15/2019  Allergen Reaction  Noted  . Penicillins Hives 12/09/2011    Family History  Problem Relation Age of Onset  . Colon cancer Father   . Rectal cancer Neg Hx     Social History   Socioeconomic History  . Marital status: Married    Spouse name: Not on file  . Number of children: Not on file  . Years of education: Not on file  . Highest education level: Not on file  Occupational History  . Not on file  Social Needs  . Financial resource strain: Not on file  . Food insecurity    Worry: Not on file    Inability: Not on file  . Transportation needs    Medical: Not on file    Non-medical: Not on file  Tobacco Use  . Smoking status: Never Smoker  . Smokeless tobacco: Never Used  Substance and Sexual Activity  . Alcohol use: No  . Drug use: No  . Sexual activity: Not on file  Lifestyle  . Physical activity    Days per week: Not on file    Minutes per session: Not on file  . Stress: Not on file  Relationships  . Social Herbalist on phone: Not on file    Gets together: Not on file    Attends religious service: Not on file    Active member of club or organization: Not on file    Attends meetings of clubs or organizations: Not on file     Relationship status: Not on file  . Intimate partner violence    Fear of current or ex partner: Not on file    Emotionally abused: Not on file    Physically abused: Not on file    Forced sexual activity: Not on file  Other Topics Concern  . Not on file  Social History Narrative  . Not on file    REVIEW OF SYSTEMS: Constitutional: Weakness, due to not eating for several days. ENT:  No nose bleeds Pulm: No cough, no shortness of breath CV:  No palpitations, no LE edema.  GU:  No hematuria, no frequency GI: See HPI.  No dysphagia.  Brown stools. Heme: Denies excessive or unusual bleeding or bruising. Transfusions: None. Neuro:  No headaches, no peripheral tingling or numbness.  No dizziness.  No syncope, no seizures.  Requires magnifying glasses to read, wears bifocals. Derm:  No itching, no rash or sores.  Endocrine:  No sweats or chills.  No polyuria or dysuria Immunization: Not queried. Travel:  None beyond local counties in last few months.    PHYSICAL EXAM: Vital signs in last 24 hours: Vitals:   06/16/19 0553 06/16/19 0631  BP: (!) 184/106   Pulse:  (!) 117  Resp: 16   Temp: 98.6 F (37 C)   SpO2:  100%   Wt Readings from Last 3 Encounters:  06/16/19 65.9 kg  04/29/19 69.9 kg  04/01/19 70 kg    General: Patient's history vacillates and is imprecise.  She is alert, fully oriented. Head: No facial asymmetry or swelling.  No signs of head trauma. Eyes: No scleral icterus, no conjunctival pallor.  EOMI. Ears: Not HOH Nose: No congestion or discharge Mouth: Moist, pink, clear oropharynx.  Tongue midline.  Poor dentition, the bulk of her teeth are missing. Neck: No JVD, masses, thyromegaly. Lungs: Clear bilaterally.  No labored breathing or cough Heart: RRR.  S1, S2 audible.  No MRG Abdomen: Soft.  Not distended or tender.  Minor to  mild epigastric tenderness without guarding or rebound.  No HSM, masses, hernias, bruits.  Bowel sounds active.   Rectal: Deferred  Musc/Skeltl: No gross joint redness, swelling or deformities. Extremities: No CCE. Neurologic: Oriented x3.  History vacillates.  Unable to tell me what meds she takes.  Moves all 4 limbs, no tremors. Skin: No suspicious sores.  No rashes, no significant bruising/purpura Tattoos: None observed Nodes: No cervical adenopathy Psych: Pleasant, calm.  Intake/Output from previous day: 11/04 0701 - 11/05 0700 In: 200 [IV Piggyback:200] Out: -  Intake/Output this shift: No intake/output data recorded.  LAB RESULTS: Recent Labs    06/15/19 1350 06/15/19 1930 06/15/19 2135 06/16/19 0427  WBC 14.9*  --  13.6* 13.2*  HGB 13.5 13.9 12.3 11.1*  HCT 43.9 41.0 40.0 37.6  PLT 277  --  233 178   BMET Lab Results  Component Value Date   NA 149 (H) 06/16/2019   NA 148 (H) 06/15/2019   NA 146 (H) 06/15/2019   K 4.3 06/16/2019   K 4.6 06/15/2019   K 4.8 06/15/2019   CL 119 (H) 06/16/2019   CL 108 06/15/2019   CO2 19 (L) 06/16/2019   CO2 23 06/15/2019   GLUCOSE 316 (H) 06/16/2019   GLUCOSE 419 (H) 06/15/2019   BUN 38 (H) 06/16/2019   BUN 40 (H) 06/15/2019   CREATININE 1.69 (H) 06/16/2019   CREATININE 2.08 (H) 06/15/2019   CREATININE 2.00 (H) 06/15/2019   CALCIUM 8.9 06/16/2019   CALCIUM 10.0 06/15/2019   LFT Recent Labs    06/15/19 1350 06/16/19 0427  PROT 7.9 6.6  ALBUMIN 3.9 3.3*  AST 17 14*  ALT 20 15  ALKPHOS 154* 125  BILITOT 0.8 0.5   PT/INR No results found for: INR, PROTIME Hepatitis Panel No results for input(s): HEPBSAG, HCVAB, HEPAIGM, HEPBIGM in the last 72 hours. C-Diff No components found for: CDIFF Lipase     Component Value Date/Time   LIPASE 22 06/15/2019 1350    Drugs of Abuse  No results found for: LABOPIA, COCAINSCRNUR, LABBENZ, AMPHETMU, THCU, LABBARB   RADIOLOGY STUDIES: Ct Abdomen Pelvis Wo Contrast  Result Date: 06/15/2019 CLINICAL DATA:  Abdominal pain, epigastric pain EXAM: CT ABDOMEN AND PELVIS WITHOUT CONTRAST TECHNIQUE:  Multidetector CT imaging of the abdomen and pelvis was performed following the standard protocol without IV contrast. COMPARISON:  None. FINDINGS: Lower chest: Lung bases are clear. No effusions. Heart is normal size. Hepatobiliary: No focal liver abnormality is seen. Status post cholecystectomy. No biliary dilatation. Pancreas: No focal abnormality or ductal dilatation. Spleen: No focal abnormality.  Normal size. Adrenals/Urinary Tract: Gas within the urinary bladder, presumably from recent catheterization. No renal or adrenal mass. No hydronephrosis. Punctate nonobstructing stone in the mid to lower pole of the right kidney. Stomach/Bowel: Normal appendix. Stomach, large and small bowel grossly unremarkable. Vascular/Lymphatic: Aortic and branch vessel atherosclerosis. No aneurysm or adenopathy. Reproductive: Prior hysterectomy.  No adnexal masses. Other: No free fluid or free air. Musculoskeletal: No acute bony abnormality. IMPRESSION: No acute findings in the abdomen or pelvis. Aortic atherosclerosis. Gas in the urinary bladder, presumably from recent catheterization. Electronically Signed   By: Rolm Baptise M.D.   On: 06/15/2019 19:41     IMPRESSION:   *    Recurrent, acute epigastric pain with non-bloody nausea, vomiting. No PPI or other GI protective meds at home. Home meds include meloxicam. R/o ulcer/gastritis, r/o functional pain, r/o atypical presentation of diabetic gastroparesis.. Given intermittent character of sxs, less likely mesenteric  ischemia though she does have aortic and branch vessel atherosclerosis on CT. GB is out.  Isolated elevated alk phos with O/w normal LFTs in May, July, October of this year but currently all LFTs, including alk phos, are negative  *    Anemia, normocytic.  Takes iron once daily.  *     Poorly controlled IDDM    PLAN:     *    EGD tmrw.  Given previous absolutely normal EGDs for the same problem several years back, study is likely to be low yield  but should be pursued.  Clears or diet of choice ok today.  *    She needs to stay on PPI chronically at least once a day.  Thus far during this admission she received 140 mg dose of Protonix yesterday.  Will restart Protonix 40 mg daily now   Azucena Freed  06/16/2019, 10:39 AM Phone [phone]  Attending Attestation:  I have taken an interval history, reviewed the chart and examined the patient. No family was present at the time of my evaluation.  I agree with PA Gribbin's note, impression and recommendations. Will proceed with EGD for evaluation of epigastric pain, nausea, vomiting, and early satiety given the risk for PUD on meloxicam, gastritis, H pylori, and esophagitis. May ultimately be functional dyspepsia or gastroparesis.    I consented her for endoscopy at the bedside today. We discussed risks, benefits, and alternatives to endoscopic evaluation. She had time to ask questions and ultimately asked that we proceed with EGD.   NPO at midnight in preparation for the procedure tomorrow.   The patient's primary concern is the potential for cancer. Thankfully, she reports no alarm features. It would be worth further investigating this concern.   Thornton Park, MD, MPH [facility]

## 2019-06-16 NOTE — Consult Note (Signed)
[facility] Consult: 10:39 AM 06/16/2019  LOS: 0 days    Referring Provider: Dr Barb Merino  Primary Care Physician:  Myrlene Broker, MD Primary Gastroenterologist:  Dr. Lyndel Safe    Reason for Consultation: Recurrent episodic epigastric pain.   HPI: Suzanne Pierce is a 63 y.o. female.  Poorly controlled IDDM.  CKD.  Hypothyroidism Previous cholecystectomy, BTL, hysterectomy. History of acute episodes of epigastric pain dating back many years EGDs 06/2010 and 10/2015.  Both normal studies.  Biopsies negative for H. pylori 06/2010 CTAP in 2015 negative Unclear if she has ever had gastric emptying study though it was mentioned as a possible study to be pursued in Dr. Steve Rattler office notes from [place]. 04/29/2019 colonoscopy.  Dr Lyndel Safe.  For chronic diarrhea.  The operative note mentions family history in her father of colon cancer at age 40 but this is her stepfather.  6 mm rectal polyp removed (TA).  Mild sigmoid diverticulosis.  Otherwise normal study to TI. Path from random colon biopsies: unremarkable/benign/normal. 12/14/2018 stool culture, stool WBC, stool Campylobacter, Shigella toxin negative.  Fecal lactoferrin positive. H. pylori breath test 10/25/2018 WNL. Dr. Lyndel Safe suspects IBS/D with element of postcholecystectomy diarrhea.  Has treated her with cholestyramine.  List of meds from [facility] includes Carafate 4 times daily Protonix 40 mg p.o./day.  Ferrous sulfate 325 mg TID. However she is not taking Carafate or PPI.  Referred to the ED by her PMD on 11/3 for 5 day history cough and 4 days epigastric pain anorexia, significantly decreased p.o. intake and nonbloody nausea, vomiting. GI symptoms very similar to episodes she has had dating back several years.  2 episodes led to  admissions to [facility], 1 in June, 1 in July of this year.  Because of the epigastric pain, she underwent cholecystectomy ~2017 at [facility] but this did not resolve the problem.  When she gets an attack of the symptoms, she goes to the hospital.  She does not try to ride this out at home.  Does not have access to antinausea ills and does not take any over-the-counter acid suppression or antacids.  Patient received IV fluids at the [facility] ED and was discharged.  Symptoms persisted and she returned to the [facility] ED yesterday 11/4  06/15/2019 CTAP wo contrast.  Unremarkable other than aortic/branch vessel atherosclerosis.  Gas in urinary bladder presumed due to recent catheterization.  Punctate, nonobstructing right kidney stone.  Prior cholecystectomy, hysterectomy. In mid March, Hb 9.5, normal MCV. Hgb currently 13.5 >> 11.1 following IV fluids. LFTs, lipase WNL.  Note previous elevations of alkaline phosphatase with otherwise normal LFTs.  Alk phos 12/2018: 195.  02/2019: 145.  06/01/19: 186 BUN/creat: 40/2 >> 38/1.6.  34/1.5 on 06/01/2019. Blood glucose 419.... 316.  IMA globin A1c 11.5. WBCs 14.9 >> 13.2.   COVID-19 testing pending.  Fm Hx Breast cancer in a sister, heart disease in her mother.  Hypertension in her father.  Diabetes in both parents.    Social Hx  No alcohol, no cigarettes.  Past Medical History:  Diagnosis Date  . Diabetes ([place])   . Family history of colon cancer   . GERD (gastroesophageal reflux disease)   . History of colon polyps   . Hyperlipidemia   . Hypertension   . Hypothyroidism   . Iron deficiency anemia   . Irritable bowel syndrome (IBS)    with diarrhea  . Kidney stones   . Trigger index finger of right hand     Past Surgical History:  Procedure Laterality Date  . COLONOSCOPY  12/19/2015   Mild sigmoid diverticulosis. Small internal hemorrhoids  . ESOPHAGOGASTRODUODENOSCOPY  10/31/2015   Normal EGD  . LITHOTRIPSY  01/2014  .  PARTIAL HYSTERECTOMY  2002    Prior to Admission medications   Medication Sig Start Date End Date Taking? Authorizing Provider  acetaminophen (TYLENOL) 500 MG tablet Take 500-1,000 mg by mouth every 6 (six) hours as needed for mild pain or headache.   Yes [provider]  amLODipine (NORVASC) 10 MG tablet Take 10 mg by mouth daily.   Yes [provider]  ferrous sulfate 325 (65 FE) MG tablet Take 325 mg by mouth daily with breakfast.   Yes [provider]  gabapentin (NEURONTIN) 300 MG capsule Take 300-600 mg by mouth See admin instructions. Take 300 mg by mouth in the morning, 300 mg at 2 PM, and 600 mg at bedtime 03/10/19  Yes [provider]  Insulin Detemir (LEVEMIR FLEXTOUCH) 100 UNIT/ML Pen Inject 22-30 Units into the skin See admin instructions. Inject 30 units into the skin in the morning before breakfast and 22 units at bedtime   Yes [provider]  latanoprost (XALATAN) 0.005 % ophthalmic solution Place 1 drop into both eyes 2 (two) times daily.   Yes [provider]  levothyroxine (SYNTHROID, LEVOTHROID) 88 MCG tablet Take 88 mcg by mouth daily.   Yes [provider]  methocarbamol (ROBAXIN) 500 MG tablet Take 500 mg by mouth every 6 (six) hours as needed for muscle spasms.   Yes [provider]  timolol (TIMOPTIC) 0.5 % ophthalmic solution Place 1 drop into both eyes 2 (two) times daily.  03/05/19  Yes [provider]  BD PEN NEEDLE NANO 2ND GEN 32G X 4 MM MISC as directed. use as directed 03/15/19   [provider]  Blood Glucose Monitoring Suppl (FIFTY50 GLUCOSE METER 2.0) w/Device KIT as directed. 11/22/18   [provider]  cholestyramine (QUESTRAN) 4 g packet Take once daily 2 hours before or after rest of medications. Patient not taking: Reported on 06/15/2019 11/09/18   Jackquline Denmark, MD  Difenoxin-Atropine (MOTOFEN) 1-0.025 MG TABS Take 1 tablet by mouth every 8 (eight) hours as needed.  Patient not taking: Reported on 06/15/2019 04/01/19   Jackquline Denmark, MD  meloxicam (MOBIC) 7.5 MG tablet Take 7.5 mg by mouth daily.    [provider]    Scheduled Meds: . amLODipine  10 mg Oral Daily  . heparin  5,000 Units Subcutaneous Q8H  . insulin aspart  0-20 Units Subcutaneous TID WC  . insulin aspart  0-5 Units Subcutaneous QHS  . insulin detemir  20 Units Subcutaneous BID  . sodium chloride flush  3 mL Intravenous Once   Infusions: . sodium chloride 100 mL/hr at 06/16/19 0854  . cefTRIAXone (ROCEPHIN)  IV Stopped (06/15/19 2245)  . famotidine (PEPCID) IV     PRN Meds: acetaminophen **OR** acetaminophen, ondansetron **OR** ondansetron (ZOFRAN) IV   Allergies as of 06/15/2019 - Review Complete 06/15/2019  Allergen Reaction  Noted  . Penicillins Hives 12/09/2011    Family History  Problem Relation Age of Onset  . Colon cancer Father   . Rectal cancer Neg Hx     Social History   Socioeconomic History  . Marital status: Married    Spouse name: Not on file  . Number of children: Not on file  . Years of education: Not on file  . Highest education level: Not on file  Occupational History  . Not on file  Social Needs  . Financial resource strain: Not on file  . Food insecurity    Worry: Not on file    Inability: Not on file  . Transportation needs    Medical: Not on file    Non-medical: Not on file  Tobacco Use  . Smoking status: Never Smoker  . Smokeless tobacco: Never Used  Substance and Sexual Activity  . Alcohol use: No  . Drug use: No  . Sexual activity: Not on file  Lifestyle  . Physical activity    Days per week: Not on file    Minutes per session: Not on file  . Stress: Not on file  Relationships  . Social Herbalist on phone: Not on file    Gets together: Not on file    Attends religious service: Not on file    Active member of club or organization: Not on file    Attends meetings of clubs or organizations: Not on file     Relationship status: Not on file  . Intimate partner violence    Fear of current or ex partner: Not on file    Emotionally abused: Not on file    Physically abused: Not on file    Forced sexual activity: Not on file  Other Topics Concern  . Not on file  Social History Narrative  . Not on file    REVIEW OF SYSTEMS: Constitutional: Weakness, due to not eating for several days. ENT:  No nose bleeds Pulm: No cough, no shortness of breath CV:  No palpitations, no LE edema.  GU:  No hematuria, no frequency GI: See HPI.  No dysphagia.  Brown stools. Heme: Denies excessive or unusual bleeding or bruising. Transfusions: None. Neuro:  No headaches, no peripheral tingling or numbness.  No dizziness.  No syncope, no seizures.  Requires magnifying glasses to read, wears bifocals. Derm:  No itching, no rash or sores.  Endocrine:  No sweats or chills.  No polyuria or dysuria Immunization: Not queried. Travel:  None beyond local counties in last few months.    PHYSICAL EXAM: Vital signs in last 24 hours: Vitals:   06/16/19 0553 06/16/19 0631  BP: (!) 184/106   Pulse:  (!) 117  Resp: 16   Temp: 98.6 F (37 C)   SpO2:  100%   Wt Readings from Last 3 Encounters:  06/16/19 65.9 kg  04/29/19 69.9 kg  04/01/19 70 kg    General: Patient's history vacillates and is imprecise.  She is alert, fully oriented. Head: No facial asymmetry or swelling.  No signs of head trauma. Eyes: No scleral icterus, no conjunctival pallor.  EOMI. Ears: Not HOH Nose: No congestion or discharge Mouth: Moist, pink, clear oropharynx.  Tongue midline.  Poor dentition, the bulk of her teeth are missing. Neck: No JVD, masses, thyromegaly. Lungs: Clear bilaterally.  No labored breathing or cough Heart: RRR.  S1, S2 audible.  No MRG Abdomen: Soft.  Not distended or tender.  Minor to  mild epigastric tenderness without guarding or rebound.  No HSM, masses, hernias, bruits.  Bowel sounds active.   Rectal: Deferred  Musc/Skeltl: No gross joint redness, swelling or deformities. Extremities: No CCE. Neurologic: Oriented x3.  History vacillates.  Unable to tell me what meds she takes.  Moves all 4 limbs, no tremors. Skin: No suspicious sores.  No rashes, no significant bruising/purpura Tattoos: None observed Nodes: No cervical adenopathy Psych: Pleasant, calm.  Intake/Output from previous day: 11/04 0701 - 11/05 0700 In: 200 [IV Piggyback:200] Out: -  Intake/Output this shift: No intake/output data recorded.  LAB RESULTS: Recent Labs    06/15/19 1350 06/15/19 1930 06/15/19 2135 06/16/19 0427  WBC 14.9*  --  13.6* 13.2*  HGB 13.5 13.9 12.3 11.1*  HCT 43.9 41.0 40.0 37.6  PLT 277  --  233 178   BMET Lab Results  Component Value Date   NA 149 (H) 06/16/2019   NA 148 (H) 06/15/2019   NA 146 (H) 06/15/2019   K 4.3 06/16/2019   K 4.6 06/15/2019   K 4.8 06/15/2019   CL 119 (H) 06/16/2019   CL 108 06/15/2019   CO2 19 (L) 06/16/2019   CO2 23 06/15/2019   GLUCOSE 316 (H) 06/16/2019   GLUCOSE 419 (H) 06/15/2019   BUN 38 (H) 06/16/2019   BUN 40 (H) 06/15/2019   CREATININE 1.69 (H) 06/16/2019   CREATININE 2.08 (H) 06/15/2019   CREATININE 2.00 (H) 06/15/2019   CALCIUM 8.9 06/16/2019   CALCIUM 10.0 06/15/2019   LFT Recent Labs    06/15/19 1350 06/16/19 0427  PROT 7.9 6.6  ALBUMIN 3.9 3.3*  AST 17 14*  ALT 20 15  ALKPHOS 154* 125  BILITOT 0.8 0.5   PT/INR No results found for: INR, PROTIME Hepatitis Panel No results for input(s): HEPBSAG, HCVAB, HEPAIGM, HEPBIGM in the last 72 hours. C-Diff No components found for: CDIFF Lipase     Component Value Date/Time   LIPASE 22 06/15/2019 1350    Drugs of Abuse  No results found for: LABOPIA, COCAINSCRNUR, LABBENZ, AMPHETMU, THCU, LABBARB   RADIOLOGY STUDIES: Ct Abdomen Pelvis Wo Contrast  Result Date: 06/15/2019 CLINICAL DATA:  Abdominal pain, epigastric pain EXAM: CT ABDOMEN AND PELVIS WITHOUT CONTRAST TECHNIQUE:  Multidetector CT imaging of the abdomen and pelvis was performed following the standard protocol without IV contrast. COMPARISON:  None. FINDINGS: Lower chest: Lung bases are clear. No effusions. Heart is normal size. Hepatobiliary: No focal liver abnormality is seen. Status post cholecystectomy. No biliary dilatation. Pancreas: No focal abnormality or ductal dilatation. Spleen: No focal abnormality.  Normal size. Adrenals/Urinary Tract: Gas within the urinary bladder, presumably from recent catheterization. No renal or adrenal mass. No hydronephrosis. Punctate nonobstructing stone in the mid to lower pole of the right kidney. Stomach/Bowel: Normal appendix. Stomach, large and small bowel grossly unremarkable. Vascular/Lymphatic: Aortic and branch vessel atherosclerosis. No aneurysm or adenopathy. Reproductive: Prior hysterectomy.  No adnexal masses. Other: No free fluid or free air. Musculoskeletal: No acute bony abnormality. IMPRESSION: No acute findings in the abdomen or pelvis. Aortic atherosclerosis. Gas in the urinary bladder, presumably from recent catheterization. Electronically Signed   By: Rolm Baptise M.D.   On: 06/15/2019 19:41     IMPRESSION:   *    Recurrent, acute epigastric pain with non-bloody nausea, vomiting. No PPI or other GI protective meds at home. Home meds include meloxicam. R/o ulcer/gastritis, r/o functional pain, r/o atypical presentation of diabetic gastroparesis.. Given intermittent character of sxs, less likely mesenteric  ischemia though she does have aortic and branch vessel atherosclerosis on CT. GB is out.  Isolated elevated alk phos with O/w normal LFTs in May, July, October of this year but currently all LFTs, including alk phos, are negative  *    Anemia, normocytic.  Takes iron once daily.  *     Poorly controlled IDDM    PLAN:     *    EGD tmrw.  Given previous absolutely normal EGDs for the same problem several years back, study is likely to be low yield  but should be pursued.  Clears or diet of choice ok today.  *    She needs to stay on PPI chronically at least once a day.  Thus far during this admission she received 140 mg dose of Protonix yesterday.  Will restart Protonix 40 mg daily now   Azucena Freed  06/16/2019, 10:39 AM Phone [phone]  Attending Attestation:  I have taken an interval history, reviewed the chart and examined the patient. No family was present at the time of my evaluation.  I agree with PA Gribbin's note, impression and recommendations. Will proceed with EGD for evaluation of epigastric pain, nausea, vomiting, and early satiety given the risk for PUD on meloxicam, gastritis, H pylori, and esophagitis. May ultimately be functional dyspepsia or gastroparesis.    I consented her for endoscopy at the bedside today. We discussed risks, benefits, and alternatives to endoscopic evaluation. She had time to ask questions and ultimately asked that we proceed with EGD.   NPO at midnight in preparation for the procedure tomorrow.   The patient's primary concern is the potential for cancer. Thankfully, she reports no alarm features. It would be worth further investigating this concern.   Thornton Park, MD, MPH [facility]

## 2019-06-16 NOTE — Plan of Care (Signed)
Nausea being addressed

## 2019-06-17 ENCOUNTER — Encounter (HOSPITAL_COMMUNITY)
Admission: EM | Disposition: A | Discharge status: Home / Self Care | Payer: Self-pay | Source: Home / Self Care | Attending: Internal Medicine

## 2019-06-17 ENCOUNTER — Inpatient Hospital Stay (HOSPITAL_COMMUNITY): Payer: Medicare HMO | Admitting: Certified Registered"

## 2019-06-17 ENCOUNTER — Encounter (HOSPITAL_COMMUNITY): Payer: Self-pay | Admitting: *Deleted

## 2019-06-17 DIAGNOSIS — K2970 Gastritis, unspecified, without bleeding: Secondary | ICD-10-CM

## 2019-06-17 DIAGNOSIS — K209 Esophagitis, unspecified without bleeding: Secondary | ICD-10-CM

## 2019-06-17 HISTORY — PX: ESOPHAGOGASTRODUODENOSCOPY (EGD) WITH PROPOFOL: SHX5813

## 2019-06-17 HISTORY — PX: BIOPSY: SHX5522

## 2019-06-17 LAB — BASIC METABOLIC PANEL
Anion gap: 8 (ref 5–15)
BUN: 20 mg/dL (ref 8–23)
CO2: 26 mmol/L (ref 22–32)
Calcium: 9.3 mg/dL (ref 8.9–10.3)
Chloride: 117 mmol/L — ABNORMAL HIGH (ref 98–111)
Creatinine, Ser: 1.49 mg/dL — ABNORMAL HIGH (ref 0.44–1.00)
GFR calc Af Amer: 43 mL/min — ABNORMAL LOW (ref >60)
GFR calc non Af Amer: 38 mL/min — ABNORMAL LOW (ref >60)
Glucose, Bld: 92 mg/dL (ref 70–99)
Potassium: 3.6 mmol/L (ref 3.5–5.1)
Sodium: 151 mmol/L — ABNORMAL HIGH (ref 135–145)

## 2019-06-17 LAB — CBC WITH DIFFERENTIAL/PLATELET
Abs Immature Granulocytes: 0.04 10*3/uL (ref 0.00–0.07)
Basophils Absolute: 0.1 10*3/uL (ref 0.0–0.1)
Basophils Relative: 1 %
Eosinophils Absolute: 0 10*3/uL (ref 0.0–0.5)
Eosinophils Relative: 0 %
HCT: 39.5 % (ref 36.0–46.0)
Hemoglobin: 12.2 g/dL (ref 12.0–15.0)
Immature Granulocytes: 0 %
Lymphocytes Relative: 20 %
Lymphs Abs: 2 10*3/uL (ref 0.7–4.0)
MCH: 25.4 pg — ABNORMAL LOW (ref 26.0–34.0)
MCHC: 30.9 g/dL (ref 30.0–36.0)
MCV: 82.3 fL (ref 80.0–100.0)
Monocytes Absolute: 0.7 10*3/uL (ref 0.1–1.0)
Monocytes Relative: 7 %
Neutro Abs: 7 10*3/uL (ref 1.7–7.7)
Neutrophils Relative %: 72 %
Platelets: 216 10*3/uL (ref 150–400)
RBC: 4.8 MIL/uL (ref 3.87–5.11)
RDW: 14 % (ref 11.5–15.5)
WBC: 9.9 10*3/uL (ref 4.0–10.5)
nRBC: 0 % (ref 0.0–0.2)

## 2019-06-17 LAB — GLUCOSE, CAPILLARY
Glucose-Capillary: 78 mg/dL (ref 70–99)
Glucose-Capillary: 98 mg/dL (ref 70–99)
Glucose-Capillary: 149 mg/dL — ABNORMAL HIGH (ref 70–99)
Glucose-Capillary: 129 mg/dL — ABNORMAL HIGH (ref 70–99)
Glucose-Capillary: 257 mg/dL — ABNORMAL HIGH (ref 70–99)

## 2019-06-17 LAB — PHOSPHORUS: Phosphorus: 2.8 mg/dL (ref 2.5–4.6)

## 2019-06-17 LAB — MAGNESIUM: Magnesium: 1.6 mg/dL — ABNORMAL LOW (ref 1.7–2.4)

## 2019-06-17 SURGERY — ESOPHAGOGASTRODUODENOSCOPY (EGD) WITH PROPOFOL
Anesthesia: Monitor Anesthesia Care

## 2019-06-17 MED ORDER — DEXTROSE-NACL 5-0.45 % IV SOLN
INTRAVENOUS | Status: DC
  Administered 2019-06-17 – 2019-06-19 (×4): via INTRAVENOUS

## 2019-06-17 MED ORDER — INSULIN DETEMIR 100 UNIT/ML ~~LOC~~ SOLN
20.0000 [IU] | Freq: Every day | SUBCUTANEOUS | Status: DC
  Administered 2019-06-17 – 2019-06-18 (×2): 20 [IU] via SUBCUTANEOUS
  Filled 2019-06-17 (×4): qty 0.2

## 2019-06-17 MED ORDER — LACTATED RINGERS IV SOLN
INTRAVENOUS | Status: DC
  Administered 2019-06-17: 13:00:00 via INTRAVENOUS

## 2019-06-17 MED ORDER — PROPOFOL 500 MG/50ML IV EMUL
INTRAVENOUS | Status: DC | PRN
  Administered 2019-06-17: 150 ug/kg/min via INTRAVENOUS

## 2019-06-17 MED ORDER — MAGNESIUM SULFATE 2 GM/50ML IV SOLN
2.0000 g | INTRAVENOUS | Status: AC
  Administered 2019-06-17: 2 g via INTRAVENOUS
  Filled 2019-06-17 (×2): qty 50

## 2019-06-17 SURGICAL SUPPLY — 15 items

## 2019-06-17 NOTE — Anesthesia Preprocedure Evaluation (Signed)
Anesthesia Evaluation  Patient identified by MRN, date of birth, ID band Patient awake    Reviewed: Allergy & Precautions, NPO status , Patient's Chart, lab work & pertinent test results  Airway Mallampati: I  TM Distance: >3 FB Neck ROM: Full    Dental   Pulmonary    Pulmonary exam normal        Cardiovascular hypertension, Pt. on medications Normal cardiovascular exam     Neuro/Psych    GI/Hepatic GERD  Medicated and Controlled,  Endo/Other  diabetes, Type 2, Oral Hypoglycemic Agents  Renal/GU      Musculoskeletal   Abdominal   Peds  Hematology   Anesthesia Other Findings   Reproductive/Obstetrics                             Anesthesia Physical Anesthesia Plan  ASA: II  Anesthesia Plan: MAC   Post-op Pain Management:    Induction: Intravenous  PONV Risk Score and Plan: 2 and Treatment may vary due to age or medical condition  Airway Management Planned: Nasal Cannula  Additional Equipment:   Intra-op Plan:   Post-operative Plan:   Informed Consent: I have reviewed the patients History and Physical, chart, labs and discussed the procedure including the risks, benefits and alternatives for the proposed anesthesia with the patient or authorized representative who has indicated his/her understanding and acceptance.       Plan Discussed with: CRNA and Surgeon  Anesthesia Plan Comments:         Anesthesia Quick Evaluation

## 2019-06-17 NOTE — Progress Notes (Signed)
Text page to MD on call for [facility]) to notify [facility]bg 78 and pt os NPO

## 2019-06-17 NOTE — Progress Notes (Signed)
PROGRESS NOTE    Suzanne Pierce  [id number] DOB: 06-03-1956 DOA: 06/15/2019 PCP: Myrlene Broker, MD    Brief Narrative:  63 year old with history of insulin-dependent diabetes, hypertension, hyperlipidemia, hypothyroidism and GERD and currently not on any acid suppression medications, history of colonic polyp, cholecystectomy presented to the emergency room with persistent nausea vomiting and abdominal discomfort.  She was seen at [facility] emergency room for elevated blood sugars and treated with IV fluids and insulin discharged home, seen by her primary care physician with persistent pain and was advised to go to [facility], ER.  Patient suffering from about 5 days of persistent epigastric pain, nausea and vomiting on attempt to eat, initially had some diarrhea but no bowel movement for last 2 days.  In the emergency room afebrile, blood pressure stable, tachycardic, on room air.  Creatinine 2.08.  Lactic acid 2.2.  WBC count 13.7.  Sodium 146.  CT scan abdomen pelvis within normal limits.   Assessment & Plan:   Principal Problem:   Nausea and/or vomiting Active Problems:   Diabetes mellitus ([place])   Hyperglycemia   Dehydration   ARF (acute renal failure) (HCC)   Type 2 diabetes mellitus with hyperlipidemia (H[facility])   Abdominal pain, chronic, epigastric  Intractable nausea vomiting, epigastric pain in a patient with chronic epigastric pain, chronic dyspepsia: Previous upper GI endoscopy was negative as per reports.  Recent colonoscopy was normal with polyp. Patient not on any acid suppression medications. She has significant symptoms and multiple ER visits with similar symptoms. Continue IV fluids, nausea medications, Protonix. Patient is going for upper GI endoscopy today.  Insulin-dependent type 2 diabetes with hyperglycemia: Probably aggravated by #1.   Presented with elevated blood sugars.  N.p.o. today morning, blood sugars 78 asymptomatic.   Patient is going for  procedure and is n.p.o., continue long-acting insulin and sliding scale insulin.  Once patient has reliable oral intake, will uptitrate her insulin doses.   Also needing dextrose half-normal saline due to persistent elevated sodium levels.   A1C is 11.   Dehydration with hypernatremia with acute kidney injury: Patient continues to have hypernatremia.  Early morning changed to dextrose half-normal saline.  We will recheck levels tomorrow morning to ensure stabilization.    Presumed UTI: She does have symptoms.  On Rocephin.  Continue until final cultures.  Hypomagnesemia: Replaced.  DVT prophylaxis: Heparin subcu Code Status: Full code Family Communication: None Disposition Plan: Inpatient. per procedure today.    Consultants:   Gastroenterology  Procedures:   None  Antimicrobials:   Rocephin, 06/15/2019   Subjective: Patient seen and examined.  Continues to have epigastric pain and dyspepsia, not able to eat anything.  Blood sugar 78 in the morning.  No other overnight events. Seen in the morning rounds before going to endoscopy.  Objective: Vitals:   06/16/19 2326 06/17/19 0541 06/17/19 1217 06/17/19 1300  BP: (!) 149/93 (!) 162/100 (!) 167/98 (!) 161/97  Pulse: (!) 113 (!) 106 (!) 103 (!) 108  Resp: 18 16 16 14   Temp: 98.7 F (37.1 C) 98.9 F (37.2 C) 98.8 F (37.1 C) 98.7 F (37.1 C)  TempSrc: Oral Oral Oral Oral  SpO2: 99% 98% 99% 98%  Weight:      Height:        Intake/Output Summary (Last 24 hours) at 06/17/2019 1304 Last data filed at 06/17/2019 0321 Gross per 24 hour  Intake 1310.88 ml  Output -  Net 1310.88 ml   Filed Weights   06/16/19  0553  Weight: 65.9 kg    Examination:  General exam: Appears calm and comfortable, on room air Respiratory system: Clear to auscultation. Respiratory effort normal. Cardiovascular system: S1 & S2 heard, RRR. No JVD, murmurs, rubs, gallops or clicks. No pedal edema. Gastrointestinal system: Abdomen is  nondistended, soft and mild tenderness in the epigastrium.  No organomegaly or masses felt. Normal bowel sounds heard. Central nervous system: Alert and oriented. No focal neurological deficits. Extremities: Symmetric 5 x 5 power. Skin: No rashes, lesions or ulcers Psychiatry: Judgement and insight appear normal. Mood & affect appropriate.     Data Reviewed: I have personally reviewed following labs and imaging studies  CBC: Recent Labs  Lab 06/15/19 1350 06/15/19 1930 06/15/19 2135 06/16/19 0427 06/17/19 0648  WBC 14.9*  --  13.6* 13.2* 9.9  NEUTROABS  --   --   --   --  7.0  HGB 13.5 13.9 12.3 11.1* 12.2  HCT 43.9 41.0 40.0 37.6 39.5  MCV 82.7  --  83.0 84.5 82.3  PLT 277  --  233 178 [id number]   Basic Metabolic Panel: Recent Labs  Lab 06/15/19 1350 06/15/19 1930 06/15/19 2135 06/16/19 0427 06/17/19 0648  NA 146* 148*  --  149* 151*  K 4.8 4.6  --  4.3 3.6  CL 108  --   --  119* 117*  CO2 23  --   --  19* 26  GLUCOSE 419*  --   --  316* 92  BUN 40*  --   --  38* 20  CREATININE 2.00*  --  2.08* 1.69* 1.49*  CALCIUM 10.0  --   --  8.9 9.3  MG  --   --   --   --  1.6*  PHOS  --   --   --   --  2.8   GFR: Estimated Creatinine Clearance: 36.2 mL/min (A) (by C-G formula based on SCr of 1.49 mg/dL (H)). Liver Function Tests: Recent Labs  Lab 06/15/19 1350 06/16/19 0427  AST 17 14*  ALT 20 15  ALKPHOS 154* 125  BILITOT 0.8 0.5  PROT 7.9 6.6  ALBUMIN 3.9 3.3*   Recent Labs  Lab 06/15/19 1350  LIPASE 22   No results for input(s): AMMONIA in the last 168 hours. Coagulation Profile: No results for input(s): INR, PROTIME in the last 168 hours. Cardiac Enzymes: No results for input(s): CKTOTAL, CKMB, CKMBINDEX, TROPONINI in the last 168 hours. BNP (last 3 results) No results for input(s): PROBNP in the last 8760 hours. HbA1C: Recent Labs    06/15/19 2135  HGBA1C 11.5*   CBG: Recent Labs  Lab 06/16/19 1618 06/16/19 2158 06/17/19 0543 06/17/19 0732  06/17/19 1124  GLUCAP 172* 142* 78 98 149*   Lipid Profile: No results for input(s): CHOL, HDL, LDLCALC, TRIG, CHOLHDL, LDLDIRECT in the last 72 hours. Thyroid Function Tests: No results for input(s): TSH, T4TOTAL, FREET4, T3FREE, THYROIDAB in the last 72 hours. Anemia Panel: No results for input(s): VITAMINB12, FOLATE, FERRITIN, TIBC, IRON, RETICCTPCT in the last 72 hours. Sepsis Labs: Recent Labs  Lab 06/15/19 1920 06/15/19 2135  LATICACIDVEN 2.3* 2.2*    Recent Results (from the past 240 hour(s))  Urine culture     Status: None   Collection Time: 06/15/19  4:54 PM   Specimen: Urine, Random  Result Value Ref Range Status   Specimen Description URINE, RANDOM  Final   Special Requests NONE  Final   Culture   Final  NO GROWTH Performed at [facility], [place] [address] [place], [place] [address]    Report Status 06/16/2019 FINAL  Final  SARS CORONAVIRUS 2 (TAT 6-24 HRS) Nasopharyngeal Nasopharyngeal Swab     Status: None   Collection Time: 06/16/19  8:47 AM   Specimen: Nasopharyngeal Swab  Result Value Ref Range Status   SARS Coronavirus 2 NEGATIVE NEGATIVE Final    Comment: (NOTE) SARS-CoV-2 target nucleic acids are NOT DETECTED. The SARS-CoV-2 RNA is generally detectable in upper and lower respiratory specimens during the acute phase of infection. Negative results do not preclude SARS-CoV-2 infection, do not rule out co-infections with other pathogens, and should not be used as the sole basis for treatment or other patient management decisions. Negative results must be combined with clinical observations, patient history, and epidemiological information. The expected result is Negative. Fact Sheet for Patients: SugarRoll.be Fact Sheet for Healthcare Providers: https://www.woods-mathews.com/ This test is not yet approved or cleared by the [place] FDA and  has been authorized for detection and/or diagnosis of  SARS-CoV-2 by FDA under an Emergency Use Authorization (EUA). This EUA will remain  in effect (meaning this test can be used) for the duration of the COVID-19 declaration under Section 56 4(b)(1) of the Act, 21 U.S.C. section 360bbb-3(b)(1), unless the authorization is terminated or revoked sooner. Performed at [facility], [place] [address] [place], [place] [address]          Radiology Studies: Ct Abdomen Pelvis Wo Contrast  Result Date: 06/15/2019 CLINICAL DATA:  Abdominal pain, epigastric pain EXAM: CT ABDOMEN AND PELVIS WITHOUT CONTRAST TECHNIQUE: Multidetector CT imaging of the abdomen and pelvis was performed following the standard protocol without IV contrast. COMPARISON:  None. FINDINGS: Lower chest: Lung bases are clear. No effusions. Heart is normal size. Hepatobiliary: No focal liver abnormality is seen. Status post cholecystectomy. No biliary dilatation. Pancreas: No focal abnormality or ductal dilatation. Spleen: No focal abnormality.  Normal size. Adrenals/Urinary Tract: Gas within the urinary bladder, presumably from recent catheterization. No renal or adrenal mass. No hydronephrosis. Punctate nonobstructing stone in the mid to lower pole of the right kidney. Stomach/Bowel: Normal appendix. Stomach, large and small bowel grossly unremarkable. Vascular/Lymphatic: Aortic and branch vessel atherosclerosis. No aneurysm or adenopathy. Reproductive: Prior hysterectomy.  No adnexal masses. Other: No free fluid or free air. Musculoskeletal: No acute bony abnormality. IMPRESSION: No acute findings in the abdomen or pelvis. Aortic atherosclerosis. Gas in the urinary bladder, presumably from recent catheterization. Electronically Signed   By: Rolm Baptise M.D.   On: 06/15/2019 19:41        Scheduled Meds: . [MAR Hold] amLODipine  10 mg Oral Daily  . [MAR Hold] heparin  5,000 Units Subcutaneous Q8H  . [MAR Hold] insulin aspart  0-20 Units Subcutaneous TID WC  . [MAR Hold]  insulin aspart  0-5 Units Subcutaneous QHS  . [MAR Hold] insulin detemir  20 Units Subcutaneous QHS  . [MAR Hold] pantoprazole  40 mg Oral Q0600  . [MAR Hold] sodium chloride flush  3 mL Intravenous Once   Continuous Infusions: . [MAR Hold] cefTRIAXone (ROCEPHIN)  IV 1 g (06/16/19 1715)  . dextrose 5 % and 0.45% NaCl 100 mL/hr at 06/17/19 [id number]  . lactated ringers       LOS: 1 day    Time spent: 25 minutes    Barb Merino, MD [facility] Pager [phone]

## 2019-06-17 NOTE — Anesthesia Postprocedure Evaluation (Signed)
Anesthesia Post Note  Patient: Suzanne Pierce  Procedure(s) Performed: ESOPHAGOGASTRODUODENOSCOPY (EGD) WITH PROPOFOL (N/A ) BIOPSY     Patient location during evaluation: PACU Anesthesia Type: MAC Level of consciousness: awake and alert Pain management: pain level controlled Vital Signs Assessment: post-procedure vital signs reviewed and stable Respiratory status: spontaneous breathing, nonlabored ventilation, respiratory function stable and patient connected to nasal cannula oxygen Cardiovascular status: stable and blood pressure returned to baseline Postop Assessment: no apparent nausea or vomiting Anesthetic complications: no    Last Vitals:  Vitals:   06/17/19 1405 06/17/19 1415  BP: 128/79 (!) 147/81  Pulse: 99 100  Resp: (!) 22 12  Temp:    SpO2: 100% 98%    Last Pain:  Vitals:   06/17/19 1415  TempSrc:   PainSc: 0-No pain                 Takasha Vetere DAVID

## 2019-06-17 NOTE — Transfer of Care (Signed)
Immediate Anesthesia Transfer of Care Note  Patient: Suzanne Pierce  Procedure(s) Performed: ESOPHAGOGASTRODUODENOSCOPY (EGD) WITH PROPOFOL (N/A ) BIOPSY  Patient Location: Endoscopy Unit  Anesthesia Type:MAC  Level of Consciousness: responds to stimulation  Airway & Oxygen Therapy: Patient Spontanous Breathing and Patient connected to nasal cannula oxygen  Post-op Assessment: Report given to RN and Post -op Vital signs reviewed and stable  Post vital signs: Reviewed and stable  Last Vitals:  Vitals Value Taken Time  BP    Temp    Pulse    Resp    SpO2      Last Pain:  Vitals:   06/17/19 1300  TempSrc: Oral  PainSc: 0-No pain         Complications: No apparent anesthesia complications

## 2019-06-17 NOTE — Interval H&P Note (Signed)
History and Physical Interval Note:  06/17/2019 12:50 PM  Suzanne Pierce  has presented today for surgery, with the diagnosis of Epigastric pain, nausea, vomiting.  The various methods of treatment have been discussed with the patient and family. After consideration of risks, benefits and other options for treatment, the patient has consented to  Procedure(s): ESOPHAGOGASTRODUODENOSCOPY (EGD) WITH PROPOFOL (N/A) as a surgical intervention.  The patient's history has been reviewed, patient examined, no change in status, stable for surgery.  I have reviewed the patient's chart and labs.  Questions were answered to the patient's satisfaction.     Thornton Park

## 2019-06-17 NOTE — Op Note (Signed)
[facility] Patient Name: Suzanne Pierce Procedure Date : 06/17/2019 MRN: [id number] Attending MD: Thornton Park MD, MD Date of Birth: 25-Jul-1956 CSN: [id number] Age: 63 Admit Type: Inpatient Procedure:                Upper GI endoscopy Indications:              Epigastric abdominal pain, Nausea with vomiting Providers:                Thornton Park MD, MD, Grace Isaac, RN, Cherylynn Ridges, Technician, [facility] CRNA Referring MD:              Medicines:                Monitored Anesthesia Care Complications:            No immediate complications. Estimated blood loss:                            Minimal. Estimated Blood Loss:     Estimated blood loss was minimal. Procedure:                Pre-Anesthesia Assessment:                           - Prior to the procedure, a History and Physical                            was performed, and patient medications and                            allergies were reviewed. The patient's tolerance of                            previous anesthesia was also reviewed. The risks                            and benefits of the procedure and the sedation                            options and risks were discussed with the patient.                            All questions were answered, and informed consent                            was obtained. Prior Anticoagulants: The patient has                            taken no previous anticoagulant or antiplatelet                            agents. ASA Grade Assessment: II - A patient with  mild systemic disease. After reviewing the risks                            and benefits, the patient was deemed in                            satisfactory condition to undergo the procedure.                           After obtaining informed consent, the endoscope was                            passed under direct vision. Throughout the                   procedure, the patient's blood pressure, pulse, and                            oxygen saturations were monitored continuously. The                            [id number]) Olympus gastroscope was                            introduced through the mouth, and advanced to the                            third part of duodenum. The upper GI endoscopy was                            accomplished without difficulty. The patient                            tolerated the procedure well. Scope In: Scope Out: Findings:      LA Grade D (one or more mucosal breaks involving at least 75% of       esophageal circumference) esophagitis with no bleeding was found. The       esophagitis involves nearly the entire esophageal column. Biopsies were       taken with a cold forceps for histology and to exclude infectious       esophagitis. Estimated blood loss was minimal.      Diffuse mild inflammation was found in the gastric body and in the       gastric antrum. Biopsies were taken from the antrum, body, and fundus       with a cold forceps for histology. Estimated blood loss was minimal.      Patchy mildly erythematous mucosa without active bleeding and with no       stigmata of bleeding was found in the duodenal bulb. Biopsies were taken       with a cold forceps for histology. Estimated blood loss was minimal.      The cardia and gastric fundus were normal on retroflexion. Impression:               - LA Grade D esophagitis with no bleeding. Biopsied.                           -  Gastritis. Biopsied.                           - Erythematous duodenopathy. Biopsied. Recommendation:           - Return patient to hospital ward for ongoing care.                           - Advance diet as tolerated. Keep the head of the                            bed elevated at all times while hospitalized.                           - Reflux lifestyle modifications encouraged.                           -  Continue present medications. I recommend a                            proton pump inhibitor twice daily for 8 weeks and                            carafate 1g QID for two weeks.                           - Await pathology results.                           - Will arrange outpatient follow-up with Dr. Lyndel Safe. Procedure Code(s):        --- Professional ---                           [phone], Esophagogastroduodenoscopy, flexible,                            transoral; with biopsy, single or multiple Diagnosis Code(s):        --- Professional ---                           K20.90, Esophagitis, unspecified without bleeding                           K29.70, Gastritis, unspecified, without bleeding                           K31.89, Other diseases of stomach and duodenum                           R10.13, Epigastric pain CPT copyright 2019 [employer]. All rights reserved. The codes documented in this report are preliminary and upon coder review may  be revised to meet current compliance requirements. Thornton Park MD, MD 06/17/2019 1:57:35 PM This report has been signed electronically. Number of Addenda: 0

## 2019-06-18 LAB — BASIC METABOLIC PANEL
Anion gap: 8 (ref 5–15)
BUN: 17 mg/dL (ref 8–23)
CO2: 26 mmol/L (ref 22–32)
Calcium: 8.8 mg/dL — ABNORMAL LOW (ref 8.9–10.3)
Chloride: 113 mmol/L — ABNORMAL HIGH (ref 98–111)
Creatinine, Ser: 1.66 mg/dL — ABNORMAL HIGH (ref 0.44–1.00)
GFR calc Af Amer: 38 mL/min — ABNORMAL LOW (ref >60)
GFR calc non Af Amer: 33 mL/min — ABNORMAL LOW (ref >60)
Glucose, Bld: 109 mg/dL — ABNORMAL HIGH (ref 70–99)
Potassium: 3.4 mmol/L — ABNORMAL LOW (ref 3.5–5.1)
Sodium: 147 mmol/L — ABNORMAL HIGH (ref 135–145)

## 2019-06-18 LAB — GLUCOSE, CAPILLARY
Glucose-Capillary: 91 mg/dL (ref 70–99)
Glucose-Capillary: 132 mg/dL — ABNORMAL HIGH (ref 70–99)
Glucose-Capillary: 79 mg/dL (ref 70–99)
Glucose-Capillary: 213 mg/dL — ABNORMAL HIGH (ref 70–99)
Glucose-Capillary: 211 mg/dL — ABNORMAL HIGH (ref 70–99)

## 2019-06-18 MED ORDER — SUCRALFATE 1 GM/10ML PO SUSP
1.0000 g | Freq: Three times a day (TID) | ORAL | Status: DC
  Administered 2019-06-18 – 2019-06-19 (×5): 1 g via ORAL
  Filled 2019-06-18 (×5): qty 10

## 2019-06-18 MED ORDER — PANTOPRAZOLE SODIUM 40 MG PO TBEC
40.0000 mg | DELAYED_RELEASE_TABLET | Freq: Two times a day (BID) | ORAL | Status: DC
  Administered 2019-06-18 – 2019-06-19 (×2): 40 mg via ORAL
  Filled 2019-06-18 (×2): qty 1

## 2019-06-18 MED ORDER — POTASSIUM CHLORIDE CRYS ER 20 MEQ PO TBCR
20.0000 meq | EXTENDED_RELEASE_TABLET | Freq: Two times a day (BID) | ORAL | Status: DC
  Administered 2019-06-18 – 2019-06-19 (×3): 20 meq via ORAL
  Filled 2019-06-18 (×3): qty 1

## 2019-06-18 NOTE — Progress Notes (Signed)
PROGRESS NOTE    Suzanne Pierce  [name] DOB: [birth year] DOA: 06/15/2019 PCP: Myrlene Broker, MD    Brief Narrative:  63 year old with history of insulin-dependent diabetes, hypertension, hyperlipidemia, hypothyroidism and GERD and currently not on any acid suppression medications, history of colonic polyp, cholecystectomy presented to the emergency room with persistent nausea vomiting and abdominal discomfort.  She was seen at [facility] emergency room for elevated blood sugars and treated with IV fluids and insulin discharged home, seen by her primary care physician with persistent pain and was advised to go to [facility], ER.  Patient suffering from about 5 days of persistent epigastric pain, nausea and vomiting on attempt to eat, initially had some diarrhea but no bowel movement for last 2 days.  In the emergency room afebrile, blood pressure stable, tachycardic, on room air.  Creatinine 2.08.  Lactic acid 2.2.  WBC count 13.7.  Sodium 146.  CT scan abdomen pelvis within normal limits.   Assessment & Plan:   Principal Problem:   Nausea and/or vomiting Active Problems:   Diabetes mellitus ([place])   Hyperglycemia   Dehydration   ARF (acute renal failure) (HCC)   Type 2 diabetes mellitus with hyperlipidemia (HCC)   Abdominal pain, chronic, epigastric   Acute esophagitis  Intractable nausea vomiting, epigastric pain in a patient with chronic epigastric pain, chronic dyspepsia: Patient underwent upper GI endoscopy, found to have severe grade 4 esophagitis.   Patient is symptomatic, unable to swallow food.   Twice a day PPI and sucralfate as per GI recommendation.   Continue liquid diet and encourage oral intake.   Continue maintenance IV fluids.   Once symptoms better controlled, discharged plan on twice a day PPI and sucralfate.    Insulin-dependent type 2 diabetes with hyperglycemia: Probably aggravated by #1.   Presented with elevated blood sugars.  After starting  insulin, patient had blood sugars less than 80 at times.  Still not having reliable intake.  Continue lower doses of long-acting insulin and sliding scale insulin.  Also needing dextrose half-normal saline due to persistent elevated sodium levels.   A1C is 11.  Insulin doses to be adjusted after reliable oral intake.  Dehydration with hypernatremia with acute kidney injury: Patient continues to have hypernatremia. Encourage oral fluid intake.  We will continue 5% dextrose, sugars are acceptable. will recheck levels tomorrow morning to ensure stabilization.    Presumed UTI: Ruled out.  Cultures negative.  Discontinue Rocephin.  Hypomagnesemia: Replaced.  DVT prophylaxis: Heparin subcu Code Status: Full code Family Communication: None Disposition Plan: Anticipate discharge home tomorrow if adequate oral intake.   Consultants:   Gastroenterology  Procedures:   None  Antimicrobials:   Rocephin, 06/15/2019-06/18/2019   Subjective: Patient seen and examined.  Continues to have epigastric pain and dyspepsia, not able to eat anything.  Objective: Vitals:   06/17/19 2348 06/18/19 0557 06/18/19 0903 06/18/19 1134  BP: 140/90 126/84 (!) 154/95 (!) 144/90  Pulse: 97 92  95  Resp: 16 16  16   Temp: 98.2 F (36.8 C) 98.2 F (36.8 C)  97.6 F (36.4 C)  TempSrc: Oral Oral  Axillary  SpO2: 98% 98%  100%  Weight:      Height:        Intake/Output Summary (Last 24 hours) at 06/18/2019 1253 Last data filed at 06/17/2019 2000 Gross per 24 hour  Intake 1091.52 ml  Output -  Net 1091.52 ml   Filed Weights   06/16/19 0553  Weight: 65.9 kg  Examination:  General exam: Appears calm and comfortable, on room air Respiratory system: Clear to auscultation. Respiratory effort normal. Cardiovascular system: S1 & S2 heard, RRR. No JVD, murmurs, rubs, gallops or clicks. No pedal edema. Gastrointestinal system: Abdomen is nondistended, soft and mild tenderness in the epigastrium.  No  organomegaly or masses felt. Normal bowel sounds heard. Central nervous system: Alert and oriented. No focal neurological deficits. Extremities: Symmetric 5 x 5 power. Skin: No rashes, lesions or ulcers Psychiatry: Judgement and insight appear normal. Mood & affect appropriate.     Data Reviewed: I have personally reviewed following labs and imaging studies  CBC: Recent Labs  Lab 06/15/19 1350 06/15/19 1930 06/15/19 2135 06/16/19 0427 06/17/19 0648  WBC 14.9*  --  13.6* 13.2* 9.9  NEUTROABS  --   --   --   --  7.0  HGB 13.5 13.9 12.3 11.1* 12.2  HCT 43.9 41.0 40.0 37.6 39.5  MCV 82.7  --  83.0 84.5 82.3  PLT 277  --  233 178 [id number]   Basic Metabolic Panel: Recent Labs  Lab 06/15/19 1350 06/15/19 1930 06/15/19 2135 06/16/19 0427 06/17/19 0648 06/18/19 0308  NA 146* 148*  --  149* 151* 147*  K 4.8 4.6  --  4.3 3.6 3.4*  CL 108  --   --  119* 117* 113*  CO2 23  --   --  19* 26 26  GLUCOSE 419*  --   --  316* 92 109*  BUN 40*  --   --  38* 20 17  CREATININE 2.00*  --  2.08* 1.69* 1.49* 1.66*  CALCIUM 10.0  --   --  8.9 9.3 8.8*  MG  --   --   --   --  1.6*  --   PHOS  --   --   --   --  2.8  --    GFR: Estimated Creatinine Clearance: 32.5 mL/min (A) (by C-G formula based on SCr of 1.66 mg/dL (H)). Liver Function Tests: Recent Labs  Lab 06/15/19 1350 06/16/19 0427  AST 17 14*  ALT 20 15  ALKPHOS 154* 125  BILITOT 0.8 0.5  PROT 7.9 6.6  ALBUMIN 3.9 3.3*   Recent Labs  Lab 06/15/19 1350  LIPASE 22   No results for input(s): AMMONIA in the last 168 hours. Coagulation Profile: No results for input(s): INR, PROTIME in the last 168 hours. Cardiac Enzymes: No results for input(s): CKTOTAL, CKMB, CKMBINDEX, TROPONINI in the last 168 hours. BNP (last 3 results) No results for input(s): PROBNP in the last 8760 hours. HbA1C: Recent Labs    06/15/19 2135  HGBA1C 11.5*   CBG: Recent Labs  Lab 06/17/19 1619 06/17/19 2131 06/18/19 0559 06/18/19 0849  06/18/19 1059  GLUCAP 129* 257* 91 132* 79   Lipid Profile: No results for input(s): CHOL, HDL, LDLCALC, TRIG, CHOLHDL, LDLDIRECT in the last 72 hours. Thyroid Function Tests: No results for input(s): TSH, T4TOTAL, FREET4, T3FREE, THYROIDAB in the last 72 hours. Anemia Panel: No results for input(s): VITAMINB12, FOLATE, FERRITIN, TIBC, IRON, RETICCTPCT in the last 72 hours. Sepsis Labs: Recent Labs  Lab 06/15/19 1920 06/15/19 2135  LATICACIDVEN 2.3* 2.2*    Recent Results (from the past 240 hour(s))  Urine culture     Status: None   Collection Time: 06/15/19  4:54 PM   Specimen: Urine, Random  Result Value Ref Range Status   Specimen Description URINE, RANDOM  Final   Special Requests NONE  Final  Culture   Final    NO GROWTH Performed at [facility], [place] [address] [place], [place] [address]    Report Status 06/16/2019 FINAL  Final  SARS CORONAVIRUS 2 (TAT 6-24 HRS) Nasopharyngeal Nasopharyngeal Swab     Status: None   Collection Time: 06/16/19  8:47 AM   Specimen: Nasopharyngeal Swab  Result Value Ref Range Status   SARS Coronavirus 2 NEGATIVE NEGATIVE Final    Comment: (NOTE) SARS-CoV-2 target nucleic acids are NOT DETECTED. The SARS-CoV-2 RNA is generally detectable in upper and lower respiratory specimens during the acute phase of infection. Negative results do not preclude SARS-CoV-2 infection, do not rule out co-infections with other pathogens, and should not be used as the sole basis for treatment or other patient management decisions. Negative results must be combined with clinical observations, patient history, and epidemiological information. The expected result is Negative. Fact Sheet for Patients: SugarRoll.be Fact Sheet for Healthcare Providers: https://www.woods-mathews.com/ This test is not yet approved or cleared by the [place] FDA and  has been authorized for detection and/or diagnosis of  SARS-CoV-2 by FDA under an Emergency Use Authorization (EUA). This EUA will remain  in effect (meaning this test can be used) for the duration of the COVID-19 declaration under Section 56 4(b)(1) of the Act, 21 U.S.C. section 360bbb-3(b)(1), unless the authorization is terminated or revoked sooner. Performed at [facility], [place] [address], [place], [place] [address]          Radiology Studies: No results found.      Scheduled Meds: . amLODipine  10 mg Oral Daily  . heparin  5,000 Units Subcutaneous Q8H  . insulin aspart  0-20 Units Subcutaneous TID WC  . insulin aspart  0-5 Units Subcutaneous QHS  . insulin detemir  20 Units Subcutaneous QHS  . pantoprazole  40 mg Oral BID  . sodium chloride flush  3 mL Intravenous Once  . sucralfate  1 g Oral TID WC & HS   Continuous Infusions: . dextrose 5 % and 0.45% NaCl 100 mL/hr at 06/18/19 1147     LOS: 2 days    Time spent: 25 minutes    Barb Merino, MD [facility] Pager [phone]

## 2019-06-19 ENCOUNTER — Encounter (HOSPITAL_COMMUNITY): Payer: Self-pay | Admitting: Gastroenterology

## 2019-06-19 DIAGNOSIS — N170 Acute kidney failure with tubular necrosis: Secondary | ICD-10-CM

## 2019-06-19 LAB — BASIC METABOLIC PANEL
Anion gap: 8 (ref 5–15)
BUN: 15 mg/dL (ref 8–23)
CO2: 25 mmol/L (ref 22–32)
Calcium: 8.6 mg/dL — ABNORMAL LOW (ref 8.9–10.3)
Chloride: 110 mmol/L (ref 98–111)
Creatinine, Ser: 1.56 mg/dL — ABNORMAL HIGH (ref 0.44–1.00)
GFR calc Af Amer: 41 mL/min — ABNORMAL LOW (ref >60)
GFR calc non Af Amer: 35 mL/min — ABNORMAL LOW (ref >60)
Glucose, Bld: 162 mg/dL — ABNORMAL HIGH (ref 70–99)
Potassium: 3.5 mmol/L (ref 3.5–5.1)
Sodium: 143 mmol/L (ref 135–145)

## 2019-06-19 LAB — GLUCOSE, CAPILLARY
Glucose-Capillary: 152 mg/dL — ABNORMAL HIGH (ref 70–99)
Glucose-Capillary: 226 mg/dL — ABNORMAL HIGH (ref 70–99)
Glucose-Capillary: 171 mg/dL — ABNORMAL HIGH (ref 70–99)

## 2019-06-19 MED ORDER — PANTOPRAZOLE SODIUM 40 MG PO TBEC: 40.0000 mg | DELAYED_RELEASE_TABLET | Freq: Two times a day (BID) | ORAL | 0 refills | Status: DC

## 2019-06-19 MED ORDER — SUCRALFATE 1 GM/10ML PO SUSP: 1.0000 g | Freq: Three times a day (TID) | ORAL | 0 refills | Status: DC

## 2019-06-19 MED ORDER — ONDANSETRON HCL 4 MG PO TABS: 4.0000 mg | ORAL_TABLET | Freq: Four times a day (QID) | ORAL | 0 refills | Status: DC | PRN

## 2019-06-19 NOTE — Discharge Summary (Addendum)
Discharge Summary  [name] ZHG:[id number] DOB: April 09, 1956  PCP: Suzanne Broker, MD  Admit date: 06/15/2019 Discharge date: 06/19/2019  Time spent:  30 minutes  Recommendations for Outpatient Follow-up:  1. Follow-up with primary care provider  Discharge Diagnoses:  Active Hospital Problems   Diagnosis Date Noted   Nausea and/or vomiting 06/15/2019   Acute esophagitis    Type 2 diabetes mellitus with hyperlipidemia ([place]) 06/16/2019   Abdominal pain, chronic, epigastric    Hyperglycemia 06/15/2019   Dehydration 06/15/2019   ARF (acute renal failure) ([place]) 06/15/2019   Diabetes mellitus ([place]) 12/09/2011    Resolved Hospital Problems  No resolved problems to display.    Discharge Condition: Improved  Diet recommendation: Regular 1800-calorie ADA diet avoid spicy food or food that will exacerbate gastritis  Vitals:   06/19/19 0600 06/19/19 1113  BP: 133/80 139/82  Pulse: 86 90  Resp: 16 18  Temp: 98.2 F (36.8 C) 98.6 F (37 C)  SpO2: 100% 99%    History of present illness:  Per HPI by Dr. Lars Pinks Mohammed:HPI: Suzanne Pierce is a 63 y.o. female with medical history significant of insulin-dependent diabetes, hypertension, hyperlipidemia, hypothyroidism, GERD who was seen at [facility] a few days ago with persistent nausea vomiting abdominal discomfort.  She was seen yesterday and diagnosed with possible DKA.  Patient aggressively hydrated and sent home.  She continues to do poorly and her PCP was called.  Recommended patient on the hospital come to [facility].  She is still dehydrated but not in DKA.  Blood sugar more than 400.  Patient weak and visibly debilitated.  She has been admitted with intractable nausea vomiting dehydration.  Also hyperglycemia.  No sick contacts.  No diarrhea.Marland Kitchen  Hospital Course:  Principal Problem:   Nausea and/or vomiting Active Problems:   Diabetes mellitus ([place])   Hyperglycemia   Dehydration  ARF (acute renal failure) (HCC)   Type 2 diabetes mellitus with hyperlipidemia (HCC)   Abdominal pain, chronic, epigastric   Acute esophagitis  63 year old female with insulin-dependent diabetes mellitus uncontrolled, hypertension hyperlipidemia hypothyroidism GERD who was admitted because of intractable nausea vomiting and abdominal pain in the epigastric area for about 5 days despite being seen at another hospital ER.  Patient underwent EGD on June 17, 2019 and it showed some gastritis as well as LA grade D esophagitis.  She was started on PPI and sucralfate.  She has achieved relief of her pain she is eating well now and she is eager to be discharged home in improved condition  Procedures: EGD on June 17, 2019 they found: LA Grade D (one or more mucosal breaks involving at least 75% of esophageal circumference) esophagitis  with no bleeding was found.   Consultations:  GI  Discharge Exam: BP 139/82 (BP Location: Right Arm)    Pulse 90    Temp 98.6 F (37 C) (Oral)    Resp 18    Ht 5' 3"  (1.6 m)    Wt 65.9 kg    SpO2 99%    BMI 25.74 kg/m   General: Alert oriented x3 no distress Cardiovascular: Regular rate and rhythm Respiratory: Unlabored respiration CTA bilaterally  Discharge Instructions You were cared for by a hospitalist during your hospital stay. If you have any questions about your discharge medications or the care you received while you were in the hospital after you are discharged, you can call the unit and asked to speak with the hospitalist on call if the hospitalist  that took care of you is not available. Once you are discharged, your primary care physician will handle any further medical issues. Please note that NO REFILLS for any discharge medications will be authorized once you are discharged, as it is imperative that you return to your primary care physician (or establish a relationship with a primary care physician if you do not have one) for your aftercare needs  so that they can reassess your need for medications and monitor your lab values.  Discharge Instructions    Call MD for:  persistant nausea and vomiting   Complete by: As directed    Call MD for:  temperature >100.4   Complete by: As directed    Diet - low sodium heart healthy   Complete by: As directed    Discharge instructions   Complete by: As directed    Avoid foods that will irritate your stomach, follow-up with primary care provider in the next 1 week   Increase activity slowly   Complete by: As directed      Allergies as of 06/19/2019      Reactions   Penicillins Hives   Did it involve swelling of the face/tongue/throat, SOB, or low BP? No, hives on the arms Did it involve sudden or severe rash/hives, skin peeling, or any reaction on the inside of your mouth or nose? No Did you need to seek medical attention at a hospital or doctor's office? Yes When did it last happen? "I was 12" If all above answers are "NO", may proceed with cephalosporin use.      Medication List    STOP taking these medications   meloxicam 7.5 MG tablet Commonly known as: MOBIC     TAKE these medications   acetaminophen 500 MG tablet Commonly known as: TYLENOL Take 500-1,000 mg by mouth every 6 (six) hours as needed for mild pain or headache.   amLODipine 10 MG tablet Commonly known as: NORVASC Take 10 mg by mouth daily.   BD Pen Needle Nano 2nd Gen 32G X 4 MM Misc Generic drug: Insulin Pen Needle as directed. use as directed   cholestyramine 4 g packet Commonly known as: Questran Take once daily 2 hours before or after rest of medications.   ferrous sulfate 325 (65 FE) MG tablet Take 325 mg by mouth daily with breakfast.   Fifty50 Glucose Meter 2.0 w/Device Kit as directed.   gabapentin 300 MG capsule Commonly known as: NEURONTIN Take 300-600 mg by mouth See admin instructions. Take 300 mg by mouth in the morning, 300 mg at 2 PM, and 600 mg at bedtime   latanoprost 0.005 %  ophthalmic solution Commonly known as: XALATAN Place 1 drop into both eyes 2 (two) times daily.   Levemir FlexTouch 100 UNIT/ML Pen Generic drug: Insulin Detemir Inject 22-30 Units into the skin See admin instructions. Inject 30 units into the skin in the morning before breakfast and 22 units at bedtime   levothyroxine 88 MCG tablet Commonly known as: SYNTHROID Take 88 mcg by mouth daily.   methocarbamol 500 MG tablet Commonly known as: ROBAXIN Take 500 mg by mouth every 6 (six) hours as needed for muscle spasms.   Motofen 1-0.025 MG Tabs Generic drug: Difenoxin-Atropine Take 1 tablet by mouth every 8 (eight) hours as needed.   ondansetron 4 MG tablet Commonly known as: ZOFRAN Take 1 tablet (4 mg total) by mouth every 6 (six) hours as needed for nausea.   pantoprazole 40 MG tablet Commonly known as:  PROTONIX Take 1 tablet (40 mg total) by mouth 2 (two) times daily.   sucralfate 1 GM/10ML suspension Commonly known as: CARAFATE Take 10 mLs (1 g total) by mouth 4 (four) times daily -  with meals and at bedtime.   timolol 0.5 % ophthalmic solution Commonly known as: TIMOPTIC Place 1 drop into both eyes 2 (two) times daily.      Allergies  Allergen Reactions   Penicillins Hives    Did it involve swelling of the face/tongue/throat, SOB, or low BP? No, hives on the arms Did it involve sudden or severe rash/hives, skin peeling, or any reaction on the inside of your mouth or nose? No Did you need to seek medical attention at a hospital or doctor's office? Yes When did it last happen? "I was 12" If all above answers are "NO", may proceed with cephalosporin use.       The results of significant diagnostics from this hospitalization (including imaging, microbiology, ancillary and laboratory) are listed below for reference.    Significant Diagnostic Studies: Ct Abdomen Pelvis Wo Contrast  Result Date: 06/15/2019 CLINICAL DATA:  Abdominal pain, epigastric pain EXAM: CT  ABDOMEN AND PELVIS WITHOUT CONTRAST TECHNIQUE: Multidetector CT imaging of the abdomen and pelvis was performed following the standard protocol without IV contrast. COMPARISON:  None. FINDINGS: Lower chest: Lung bases are clear. No effusions. Heart is normal size. Hepatobiliary: No focal liver abnormality is seen. Status post cholecystectomy. No biliary dilatation. Pancreas: No focal abnormality or ductal dilatation. Spleen: No focal abnormality.  Normal size. Adrenals/Urinary Tract: Gas within the urinary bladder, presumably from recent catheterization. No renal or adrenal mass. No hydronephrosis. Punctate nonobstructing stone in the mid to lower pole of the right kidney. Stomach/Bowel: Normal appendix. Stomach, large and small bowel grossly unremarkable. Vascular/Lymphatic: Aortic and branch vessel atherosclerosis. No aneurysm or adenopathy. Reproductive: Prior hysterectomy.  No adnexal masses. Other: No free fluid or free air. Musculoskeletal: No acute bony abnormality. IMPRESSION: No acute findings in the abdomen or pelvis. Aortic atherosclerosis. Gas in the urinary bladder, presumably from recent catheterization. Electronically Signed   By: Rolm Baptise M.D.   On: 06/15/2019 19:41    Microbiology: Recent Results (from the past 240 hour(s))  Urine culture     Status: None   Collection Time: 06/15/19  4:54 PM   Specimen: Urine, Random  Result Value Ref Range Status   Specimen Description URINE, RANDOM  Final   Special Requests NONE  Final   Culture   Final    NO GROWTH Performed at [facility], [address], [place], [place] [address]    Report Status 06/16/2019 FINAL  Final  SARS CORONAVIRUS 2 (TAT 6-24 HRS) Nasopharyngeal Nasopharyngeal Swab     Status: None   Collection Time: 06/16/19  8:47 AM   Specimen: Nasopharyngeal Swab  Result Value Ref Range Status   SARS Coronavirus 2 NEGATIVE NEGATIVE Final    Comment: (NOTE) SARS-CoV-2 target nucleic acids are NOT DETECTED. The  SARS-CoV-2 RNA is generally detectable in upper and lower respiratory specimens during the acute phase of infection. Negative results do not preclude SARS-CoV-2 infection, do not rule out co-infections with other pathogens, and should not be used as the sole basis for treatment or other patient management decisions. Negative results must be combined with clinical observations, patient history, and epidemiological information. The expected result is Negative. Fact Sheet for Patients: SugarRoll.be Fact Sheet for Healthcare Providers: https://www.woods-mathews.com/ This test is not yet approved or cleared by the  [place] Architectural technologist and  has been authorized for detection and/or diagnosis of SARS-CoV-2 by FDA under an Print production planner (EUA). This EUA will remain  in effect (meaning this test can be used) for the duration of the COVID-19 declaration under Section 56 4(b)(1) of the Act, 21 U.S.C. section 360bbb-3(b)(1), unless the authorization is terminated or revoked sooner. Performed at [facility], [place] [address] [place], [place] [address]      Labs: Basic Metabolic Panel: Recent Labs  Lab 06/15/19 1350 06/15/19 1930 06/15/19 2135 06/16/19 0427 06/17/19 0648 06/18/19 0308 06/19/19 0519  NA 146* 148*  --  149* 151* 147* 143  K 4.8 4.6  --  4.3 3.6 3.4* 3.5  CL 108  --   --  119* 117* 113* 110  CO2 23  --   --  19* 26 26 25   GLUCOSE 419*  --   --  316* 92 109* 162*  BUN 40*  --   --  38* 20 17 15   CREATININE 2.00*  --  2.08* 1.69* 1.49* 1.66* 1.56*  CALCIUM 10.0  --   --  8.9 9.3 8.8* 8.6*  MG  --   --   --   --  1.6*  --   --   PHOS  --   --   --   --  2.8  --   --    Liver Function Tests: Recent Labs  Lab 06/15/19 1350 06/16/19 0427  AST 17 14*  ALT 20 15  ALKPHOS 154* 125  BILITOT 0.8 0.5  PROT 7.9 6.6  ALBUMIN 3.9 3.3*   Recent Labs  Lab 06/15/19 1350  LIPASE 22   No results for input(s): AMMONIA  in the last 168 hours. CBC: Recent Labs  Lab 06/15/19 1350 06/15/19 1930 06/15/19 2135 06/16/19 0427 06/17/19 0648  WBC 14.9*  --  13.6* 13.2* 9.9  NEUTROABS  --   --   --   --  7.0  HGB 13.5 13.9 12.3 11.1* 12.2  HCT 43.9 41.0 40.0 37.6 39.5  MCV 82.7  --  83.0 84.5 82.3  PLT 277  --  233 178 216   Cardiac Enzymes: No results for input(s): CKTOTAL, CKMB, CKMBINDEX, TROPONINI in the last 168 hours. BNP: BNP (last 3 results) No results for input(s): BNP in the last 8760 hours.  ProBNP (last 3 results) No results for input(s): PROBNP in the last 8760 hours.  CBG: Recent Labs  Lab 06/18/19 1611 06/18/19 2136 06/19/19 0602 06/19/19 1113 06/19/19 1551  GLUCAP 213* 211* 152* 226* 171*       Signed:  Cristal Deer, MD [facility] 06/19/2019, 4:22 PM

## 2019-06-20 LAB — SURGICAL PATHOLOGY

## 2019-06-22 ENCOUNTER — Encounter: Payer: Self-pay | Admitting: Gastroenterology

## 2019-06-23 ENCOUNTER — Encounter: Payer: Self-pay | Admitting: *Deleted

## 2019-07-27 ENCOUNTER — Other Ambulatory Visit: Payer: Self-pay

## 2019-07-27 ENCOUNTER — Telehealth (INDEPENDENT_AMBULATORY_CARE_PROVIDER_SITE_OTHER): Payer: Medicare HMO | Admitting: Gastroenterology

## 2019-07-27 VITALS — Ht 63.0 in | Wt 153.0 lb

## 2019-07-27 DIAGNOSIS — K2100 Gastro-esophageal reflux disease with esophagitis, without bleeding: Secondary | ICD-10-CM

## 2019-07-27 NOTE — Progress Notes (Signed)
Chief Complaint: FU  Referring Provider:  Myrlene Broker, MD      ASSESSMENT AND PLAN;   #1. IBS with diarrhea.  Assoc postcholecystectomy component. Neg stool studies 12/2018. Failed cholestryamine. Neg random colonic Bx for microscopic colitis 04/2019.  #2.  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 FH colon cancer (dad>60).  Next colon due 04/2026.  Earlier, if with any new problems or change in family history.  #3. GERD with EE (EGD 06/2019 Gd D esophagitis, neg SB Bx, neg HP)  Plan:  - Continue protonix 60m po Bid until mid Jan, then QD. - Nonpharmacologic means of reflux control. - FU in 3 months.  HPI:    Suzanne CARBONis a 63y.o. female  For follow-up visit Feels much better Minimal diarrhea. No heartburn. No nausea, vomiting, odynophagia or dysphagia.   Underwent EGD 06/17/2019 which showed LA grade D esophagitis, gastroduodenitis.  She was treated with Protonix 40 mg p.o. twice daily and Carafate 1 g p.o. 4 times daily for 2 weeks.  She is done with Carafate.  Continues on Protonix twice daily.  Past Medical History:  Diagnosis Date  . [place]   . Family history of colon cancer   . GERD (gastroesophageal reflux disease)   . History of colon polyps   . Hyperlipidemia   . Hypertension   . Hypothyroidism   . Iron deficiency anemia   . Irritable bowel syndrome (IBS)    with diarrhea  . Kidney stones   . Trigger index finger of right hand      Family History  Problem Relation Age of Onset  . Colon cancer Father   . Rectal cancer Neg Hx     Social History   Tobacco Use  . Smoking status: Never Smoker  . Smokeless tobacco: Never Used  Substance Use Topics  . Alcohol use: No  . Drug use: No    Current Outpatient Medications  Medication Sig Dispense Refill  . acetaminophen (TYLENOL) 500 MG tablet Take 500-1,000 mg by mouth every 6 (six) hours as needed for mild pain or headache.    .Marland KitchenamLODipine (NORVASC) 10 MG tablet Take 10 mg by  mouth daily.    . BD PEN NEEDLE NANO 2ND GEN 32G X 4 MM MISC as directed. use as directed    . Blood Glucose Monitoring Suppl (FIFTY50 GLUCOSE METER 2.0) w/Device KIT as directed.    . ferrous sulfate 325 (65 FE) MG tablet Take 325 mg by mouth daily with breakfast.    . gabapentin (NEURONTIN) 300 MG capsule Take 300-600 mg by mouth See admin instructions. Take 300 mg by mouth in the morning, 300 mg at 2 PM, and 600 mg at bedtime    . Insulin Detemir (LEVEMIR FLEXTOUCH) 100 UNIT/ML Pen Inject 22-30 Units into the skin See admin instructions. Inject 30 units into the skin in the morning before breakfast and 23 units at bedtime    . latanoprost (XALATAN) 0.005 % ophthalmic solution Place 1 drop into both eyes 2 (two) times daily.    .Marland Kitchenlevothyroxine (SYNTHROID, LEVOTHROID) 88 MCG tablet Take 88 mcg by mouth daily.    . meloxicam (MOBIC) 7.5 MG tablet Take 7.5 mg by mouth as needed for pain.    .Marland Kitchenondansetron (ZOFRAN) 4 MG tablet Take 1 tablet (4 mg total) by mouth every 6 (six) hours as needed for nausea. 20 tablet 0  . pantoprazole (PROTONIX) 40 MG tablet Take 1 tablet (40 mg total) by  mouth 2 (two) times daily. 30 tablet 0  . sucralfate (CARAFATE) 1 GM/10ML suspension Take 10 mLs (1 g total) by mouth 4 (four) times daily -  with meals and at bedtime. 420 mL 0  . timolol (TIMOPTIC) 0.5 % ophthalmic solution Place 1 drop into both eyes 2 (two) times daily.      No current facility-administered medications for this visit.    Allergies  Allergen Reactions  . Penicillins Hives    Did it involve swelling of the face/tongue/throat, SOB, or low BP? No, hives on the arms Did it involve sudden or severe rash/hives, skin peeling, or any reaction on the inside of your mouth or nose? No Did you need to seek medical attention at a hospital or doctor's office? Yes When did it last happen? "I was 12" If all above answers are "NO", may proceed with cephalosporin use.     Review of Systems:  neg     Physical Exam:    Today's Vitals   07/27/19 0808  Weight: 153 lb (69.4 kg)  Height: 5' 3"  (1.6 m)   Body mass index is 27.1 kg/m. Not examined since it was a televisit  CBC Latest Ref Rng & Units 06/17/2019 06/16/2019 06/15/2019  WBC 4.0 - 10.5 K/uL 9.9 13.2(H) 13.6(H)  Hemoglobin 12.0 - 15.0 g/dL 12.2 11.1(L) 12.3  Hematocrit 36.0 - 46.0 % 39.5 37.6 40.0  Platelets 150 - 400 K/uL 216 178 233   CMP Latest Ref Rng & Units 06/19/2019 06/18/2019 06/17/2019  Glucose 70 - 99 mg/dL 162(H) 109(H) 92  BUN 8 - 23 mg/dL 15 17 20   Creatinine 0.44 - 1.00 mg/dL 1.56(H) 1.66(H) 1.49(H)  Sodium 135 - 145 mmol/L 143 147(H) 151(H)  Potassium 3.5 - 5.1 mmol/L 3.5 3.4(L) 3.6  Chloride 98 - 111 mmol/L 110 113(H) 117(H)  CO2 22 - 32 mmol/L 25 26 26   Calcium 8.9 - 10.3 mg/dL 8.6(L) 8.8(L) 9.3  Total Protein 6.5 - 8.1 g/dL - - -  Total Bilirubin 0.3 - 1.2 mg/dL - - -  Alkaline Phos 38 - 126 U/L - - -  AST 15 - 41 U/L - - -  ALT 0 - 44 U/L - - -  I connected with  Piedad Climes on 07/27/19 by a video enabled telemedicine application and verified that I am speaking with the correct person using two identifiers.   I discussed the limitations of evaluation and management by telemedicine. The patient expressed understanding and agreed to proceed.  Time spent: 15 min   Carmell Austria, MD 07/27/2019, 10:02 AM  Cc: Myrlene Broker, MD

## 2019-07-27 NOTE — Patient Instructions (Signed)
If you are age 61 or older, your body mass index should be between 23-30. Your Body mass index is 27.1 kg/m. If this is out of the aforementioned range listed, please consider follow up with your Primary Care Provider.  If you are age 27 or younger, your body mass index should be between 19-25. Your Body mass index is 27.1 kg/m. If this is out of the aformentioned range listed, please consider follow up with your Primary Care Provider.   Continue Protonix 40 mg twice daily until mid January and then once daily.   Follow up in 3 months.   Thank you,  Dr. Jackquline Denmark

## 2019-10-01 ENCOUNTER — Other Ambulatory Visit: Payer: Self-pay

## 2019-10-01 ENCOUNTER — Encounter (HOSPITAL_COMMUNITY): Payer: Self-pay | Admitting: Emergency Medicine

## 2019-10-01 ENCOUNTER — Emergency Department (HOSPITAL_COMMUNITY)
Admission: EM | Admit: 2019-10-01 | Discharge: 2019-10-01 | Disposition: A | Discharge status: Home / Self Care | Payer: Medicare HMO | Attending: Emergency Medicine | Admitting: Emergency Medicine

## 2019-10-01 ENCOUNTER — Emergency Department (HOSPITAL_COMMUNITY): Payer: Medicare HMO

## 2019-10-01 DIAGNOSIS — R739 Hyperglycemia, unspecified: Secondary | ICD-10-CM

## 2019-10-01 DIAGNOSIS — E039 Hypothyroidism, unspecified: Secondary | ICD-10-CM | POA: Diagnosis not present

## 2019-10-01 DIAGNOSIS — N390 Urinary tract infection, site not specified: Secondary | ICD-10-CM | POA: Insufficient documentation

## 2019-10-01 DIAGNOSIS — K921 Melena: Secondary | ICD-10-CM | POA: Insufficient documentation

## 2019-10-01 DIAGNOSIS — Z79899 Other long term (current) drug therapy: Secondary | ICD-10-CM | POA: Diagnosis not present

## 2019-10-01 DIAGNOSIS — R1013 Epigastric pain: Secondary | ICD-10-CM | POA: Diagnosis present

## 2019-10-01 DIAGNOSIS — R000 Tachycardia, unspecified: Secondary | ICD-10-CM | POA: Insufficient documentation

## 2019-10-01 DIAGNOSIS — Z794 Long term (current) use of insulin: Secondary | ICD-10-CM | POA: Insufficient documentation

## 2019-10-01 DIAGNOSIS — E1165 Type 2 diabetes mellitus with hyperglycemia: Secondary | ICD-10-CM | POA: Diagnosis not present

## 2019-10-01 DIAGNOSIS — I10 Essential (primary) hypertension: Secondary | ICD-10-CM | POA: Insufficient documentation

## 2019-10-01 LAB — COMPREHENSIVE METABOLIC PANEL
ALT: 24 U/L (ref 0–44)
AST: 20 U/L (ref 15–41)
Albumin: 4 g/dL (ref 3.5–5.0)
Alkaline Phosphatase: 142 U/L — ABNORMAL HIGH (ref 38–126)
Anion gap: 12 (ref 5–15)
BUN: 22 mg/dL (ref 8–23)
CO2: 20 mmol/L — ABNORMAL LOW (ref 22–32)
Calcium: 10.8 mg/dL — ABNORMAL HIGH (ref 8.9–10.3)
Chloride: 110 mmol/L (ref 98–111)
Creatinine, Ser: 1.64 mg/dL — ABNORMAL HIGH (ref 0.44–1.00)
GFR calc Af Amer: 39 mL/min — ABNORMAL LOW (ref >60)
GFR calc non Af Amer: 33 mL/min — ABNORMAL LOW (ref >60)
Glucose, Bld: 334 mg/dL — ABNORMAL HIGH (ref 70–99)
Potassium: 4.3 mmol/L (ref 3.5–5.1)
Sodium: 142 mmol/L (ref 135–145)
Total Bilirubin: 0.5 mg/dL (ref 0.3–1.2)
Total Protein: 8 g/dL (ref 6.5–8.1)

## 2019-10-01 LAB — TYPE AND SCREEN
ABO/RH(D): A POS
Antibody Screen: NEGATIVE

## 2019-10-01 LAB — CBC
HCT: 40.7 % (ref 36.0–46.0)
Hemoglobin: 13 g/dL (ref 12.0–15.0)
MCH: 26.2 pg (ref 26.0–34.0)
MCHC: 31.9 g/dL (ref 30.0–36.0)
MCV: 81.9 fL (ref 80.0–100.0)
Platelets: 278 10*3/uL (ref 150–400)
RBC: 4.97 MIL/uL (ref 3.87–5.11)
RDW: 13.8 % (ref 11.5–15.5)
WBC: 9.4 10*3/uL (ref 4.0–10.5)
nRBC: 0 % (ref 0.0–0.2)

## 2019-10-01 LAB — URINALYSIS, ROUTINE W REFLEX MICROSCOPIC
Bilirubin Urine: NEGATIVE
Glucose, UA: >=500 mg/dL — AB
Ketones, ur: 5 mg/dL — AB
Nitrite: NEGATIVE
Protein, ur: 100 mg/dL — AB
Specific Gravity, Urine: 1.014 (ref 1.005–1.030)
WBC, UA: >50 WBC/hpf — ABNORMAL HIGH (ref 0–5)
pH: 5 (ref 5.0–8.0)

## 2019-10-01 LAB — ABO/RH: ABO/RH(D): A POS

## 2019-10-01 LAB — LIPASE, BLOOD: Lipase: 22 U/L (ref 11–51)

## 2019-10-01 LAB — CBG MONITORING, ED: Glucose-Capillary: 318 mg/dL — ABNORMAL HIGH (ref 70–99)

## 2019-10-01 LAB — POC OCCULT BLOOD, ED: Fecal Occult Bld: NEGATIVE

## 2019-10-01 MED ORDER — FAMOTIDINE IN NACL 20-0.9 MG/50ML-% IV SOLN
20.0000 mg | Freq: Once | INTRAVENOUS | Status: AC
  Administered 2019-10-01: 20 mg via INTRAVENOUS
  Filled 2019-10-01: qty 50

## 2019-10-01 MED ORDER — CEPHALEXIN 250 MG PO CAPS: 250.0000 mg | ORAL_CAPSULE | Freq: Four times a day (QID) | ORAL | 0 refills | Status: AC

## 2019-10-01 MED ORDER — SODIUM CHLORIDE 0.9% FLUSH: 3.0000 mL | Freq: Once | INTRAVENOUS | Status: DC

## 2019-10-01 MED ORDER — SODIUM CHLORIDE 0.9 % IV BOLUS: 500.0000 mL | Freq: Once | INTRAVENOUS | Status: DC

## 2019-10-01 MED ORDER — SODIUM CHLORIDE 0.9 % IV BOLUS
1000.0000 mL | Freq: Once | INTRAVENOUS | Status: AC
  Administered 2019-10-01: 1000 mL via INTRAVENOUS

## 2019-10-01 MED ORDER — ONDANSETRON HCL 4 MG/2ML IJ SOLN
4.0000 mg | Freq: Once | INTRAMUSCULAR | Status: AC
  Administered 2019-10-01: 4 mg via INTRAVENOUS
  Filled 2019-10-01: qty 2

## 2019-10-01 NOTE — ED Notes (Signed)
Pt returned from c-t she reports that she feels a little better

## 2019-10-01 NOTE — ED Provider Notes (Signed)
Medical screening examination/treatment/procedure(s) were conducted as a shared visit with non-physician practitioner(s) and myself.  I personally evaluated the patient during the encounter.  EKG Interpretation  Date/Time:  Saturday October 01 2019 16:47:52 EST Ventricular Rate:  101 PR Interval:  172 QRS Duration: 78 QT Interval:  334 QTC Calculation: 433 R Axis:   37 Text Interpretation: Sinus tachycardia ST & T wave abnormality, consider lateral ischemia Abnormal ECG No significant change since last tracing Confirmed by Fredia Sorrow [phone]) on 10/01/2019 8:40:41 PM   Patient seen by me along with the physician assistant.  Patient reports nausea since Wednesday blood sugars in the 200s reported left upper quadrant abdominal pain since this morning with dark red bloody diarrhea and vomited x3.  Work-up however Hemoccult was negative hemoglobin without significant evidence of anemia or blood loss.  Oxygen saturations are 99% to 100%.  Patient's past medical history is significant for iron deficiency anemia hyperlipidemia hypertension irritable bowel syndrome GERD history of colon polyps diabetes.  Patient's last colonoscopy they have listed was 2017 showed evidence of sigmoid diverticulosis.  Patient tells me that her pain is more in the upper part of the abdomen.  No evidence of any significant GI blood loss.  Vital signs have been fine other than a slight tachycardia.  It is a sinus tach.  Patient denies any chest pain.  Liver function test are normal except for an alk phos of 142.  No leukocytosis.  Lipase also normal.  Will get CT of the abdomen.  If normal would recheck vital signs.  If they remain relatively stable even though she is slightly tachycardic in the low 100s can be discharged and follow-up with her doctors.  Symptoms being kind of in the upper abdomen could always be related to a stomach ulcer.  But it appears that patient is already on Protonix patient is on Carafate.  Most  likely patient would be able to be discharged home.   Fredia Sorrow, MD 10/01/19 2046

## 2019-10-01 NOTE — ED Provider Notes (Signed)
Physical Exam  BP (!) 166/83   Pulse (!) 114   Temp 98.5 F (36.9 C)   Resp 14   SpO2 99%   Physical Exam  Gen: appears nontoxic CV: mildly tachycardic. 108 on exam Abd: mild epigastric tenderness. No rigidity or distention.   ED Course/Procedures   Clinical Course as of Sep 30 2344  Sat Oct 01, 2019  2024 Creatinine(!): 1.64 [HK]  2027 Glucose(!): 334 [HK]  2027 Fecal Occult Blood, POC: NEGATIVE [HK]    Clinical Course User Index [HK] Delia Heady, PA-C    Procedures  MDM   Patient presenting for evaluation of epigastric abdominal pain.  She reports she vomited x1 today, tasted like blood.  Patient states yesterday her stools appeared tarry.  She has had an EGD and colonoscopy in the past 6 to 8 months.  EGD showed esophagitis, colonoscopy showed an internal hemorrhoid and polyps were removed.  She follows with with our GI.  Work-up today in the ER has been reassuring.  Labs are good with a normal hemoglobin.  Hemoccult was negative.  Delayed treatment due to difficulty obtaining IV access. Patient is pending symptomatic treatment and CT.  If CT is normal, plan for p.o. challenge and reassess. Of note, patient also has UTI.  CT negative for acute findings.  On reassessment after treatment, patient reports her pain and nausea is improved.  Will p.o. challenge and reassess.  Patient's heart rate remains elevated despite fluids.  On reassessment, heart rate was 108, improved from previous.  She states she feels she will be able to go home.  I encourage close follow-up with her GI doctor.  We will treat UTI with antibiotics.  Discussed with attending, Dr. Sedonia Small evaluated the patient.  At this time, patient appears safe for discharge.  Return precautions given.  Patient states she understands and agrees to plan.  Results for orders placed or performed during the hospital encounter of 10/01/19  Comprehensive metabolic panel  Result Value Ref Range   Sodium 142 135 - 145 mmol/L    Potassium 4.3 3.5 - 5.1 mmol/L   Chloride 110 98 - 111 mmol/L   CO2 20 (L) 22 - 32 mmol/L   Glucose, Bld 334 (H) 70 - 99 mg/dL   BUN 22 8 - 23 mg/dL   Creatinine, Ser 1.64 (H) 0.44 - 1.00 mg/dL   Calcium 10.8 (H) 8.9 - 10.3 mg/dL   Total Protein 8.0 6.5 - 8.1 g/dL   Albumin 4.0 3.5 - 5.0 g/dL   AST 20 15 - 41 U/L   ALT 24 0 - 44 U/L   Alkaline Phosphatase 142 (H) 38 - 126 U/L   Total Bilirubin 0.5 0.3 - 1.2 mg/dL   GFR calc non Af Amer 33 (L) >60 mL/min   GFR calc Af Amer 39 (L) >60 mL/min   Anion gap 12 5 - 15  CBC  Result Value Ref Range   WBC 9.4 4.0 - 10.5 K/uL   RBC 4.97 3.87 - 5.11 MIL/uL   Hemoglobin 13.0 12.0 - 15.0 g/dL   HCT 40.7 36.0 - 46.0 %   MCV 81.9 80.0 - 100.0 fL   MCH 26.2 26.0 - 34.0 pg   MCHC 31.9 30.0 - 36.0 g/dL   RDW 13.8 11.5 - 15.5 %   Platelets 278 150 - 400 K/uL   nRBC 0.0 0.0 - 0.2 %  Lipase, blood  Result Value Ref Range   Lipase 22 11 - 51 U/L  Urinalysis, Routine  w reflex microscopic  Result Value Ref Range   Color, Urine YELLOW YELLOW   APPearance HAZY (A) CLEAR   Specific Gravity, Urine 1.014 1.005 - 1.030   pH 5.0 5.0 - 8.0   Glucose, UA >=500 (A) NEGATIVE mg/dL   Hgb urine dipstick MODERATE (A) NEGATIVE   Bilirubin Urine NEGATIVE NEGATIVE   Ketones, ur 5 (A) NEGATIVE mg/dL   Protein, ur 100 (A) NEGATIVE mg/dL   Nitrite NEGATIVE NEGATIVE   Leukocytes,Ua LARGE (A) NEGATIVE   RBC / HPF 6-10 0 - 5 RBC/hpf   WBC, UA >50 (H) 0 - 5 WBC/hpf   Bacteria, UA RARE (A) NONE SEEN   Squamous Epithelial / LPF 0-5 0 - 5   Mucus PRESENT    Hyaline Casts, UA PRESENT   POC occult blood, ED  Result Value Ref Range   Fecal Occult Bld NEGATIVE NEGATIVE  CBG monitoring, ED  Result Value Ref Range   Glucose-Capillary 318 (H) 70 - 99 mg/dL  Type and screen [place]  Result Value Ref Range   ABO/RH(D) A POS    Antibody Screen NEG    Sample Expiration      10/04/2019,2359 Performed at [facility], [address]  [place], [place] [address]   ABO/Rh  Result Value Ref Range   ABO/RH(D)      A POS Performed at [facility] [address] [place], [place] [address]    CT ABDOMEN PELVIS WO CONTRAST  Result Date: 10/01/2019 CLINICAL DATA:  Epigastric pain and nausea EXAM: CT ABDOMEN AND PELVIS WITHOUT CONTRAST TECHNIQUE: Multidetector CT imaging of the abdomen and pelvis was performed following the standard protocol without IV contrast. COMPARISON:  06/15/2019 FINDINGS: LOWER CHEST: Normal. HEPATOBILIARY: Normal hepatic contours. No intra- or extrahepatic biliary dilatation. Status post cholecystectomy. PANCREAS: Normal pancreas. No ductal dilatation or peripancreatic fluid collection. SPLEEN: Normal. ADRENALS/URINARY TRACT: The adrenal glands are normal. No hydronephrosis, nephroureterolithiasis or solid renal mass. Right renal parenchymal calcification at the lower pole. This appears to be outside of the collecting system. The urinary bladder is normal for degree of distention STOMACH/BOWEL: There is no hiatal hernia. Normal duodenal course and caliber. No small bowel dilatation or inflammation. No focal colonic abnormality. Normal appendix. VASCULAR/LYMPHATIC: There is calcific atherosclerosis of the abdominal aorta. Long segment SMA calcification. No abdominal or pelvic lymphadenopathy. REPRODUCTIVE: Status post hysterectomy. No adnexal mass. MUSCULOSKELETAL. No bony spinal canal stenosis or focal osseous abnormality. OTHER: None. IMPRESSION: No acute abnormality of the abdomen or pelvis. Electronically Signed   By: Ulyses Jarred M.D.   On: 10/01/2019 21:51        Franchot Heidelberg, PA-C 10/01/19 2349    Maudie Flakes, MD 10/02/19 [phone]

## 2019-10-01 NOTE — Discharge Instructions (Signed)
Continue taking home medications as prescribed. Take antibiotics as prescribed. Follow-up with your GI doctors as needed if your symptoms persist. Return to emergency room if you develop fevers, persistent vomiting, severe worsening pain, dizziness/lightheadedness/weakness, or any new, worsening, or concerning symptoms.

## 2019-10-01 NOTE — ED Triage Notes (Signed)
Pt reports nausea since Wednesday and blood sugars in the 200s.  Reports LUQ pain since this morning with dark red bloody diarrhea and vomited blood x 3.  Pt lethargic.

## 2019-10-01 NOTE — ED Notes (Signed)
Iv team here 

## 2019-10-01 NOTE — ED Notes (Signed)
The pt has had abd pain for several days  With nausea

## 2019-10-01 NOTE — ED Provider Notes (Signed)
[facility] EMERGENCY DEPARTMENT Provider Note   CSN: [id number] Arrival date & time: 10/01/19  1642     History Chief Complaint  Patient presents with  . Abdominal Pain  . GI Bleeding    BARBERA PERRITT is a 64 y.o. female with a past medical history of hypertension, hyperlipidemia, IBS D, diabetes, GERD, gastritis/esophagitis, presenting to the ED with a chief complaint of epigastric abdominal pain, emesis and dark stools.  2 days ago started having intermittent epigastric pain and one episode of diarrhea.  Yesterday had a bowel movement that consisted of dark stools.  Had an episode today where she felt like she tasted blood when she vomited.  She reports compliance with her home Protonix.  States that she did eat some pizza before symptoms began and that her daughter had similar vomiting and diarrhea after eating the pizza as well.  She denies any chest pain, shortness of breath but does feel weak all over as well as fatigue.   Her last EGD was November 2020 which showed esophagitis.  Last colonoscopy was September 2020 which showed polyps and nonbleeding internal hemorrhoids.  HPI     Past Medical History:  Diagnosis Date  . Diabetes ([place])   . Family history of colon cancer   . GERD (gastroesophageal reflux disease)   . History of colon polyps   . Hyperlipidemia   . Hypertension   . Hypothyroidism   . Iron deficiency anemia   . Irritable bowel syndrome (IBS)    with diarrhea  . Kidney stones   . Trigger index finger of right hand     Patient Active Problem List   Diagnosis Date Noted  . Acute esophagitis   . Type 2 diabetes mellitus with hyperlipidemia ([place]) 06/16/2019  . Abdominal pain, chronic, epigastric   . Nausea and/or vomiting 06/15/2019  . Hyperglycemia 06/15/2019  . Dehydration 06/15/2019  . ARF (acute renal failure) ([place]) 06/15/2019  . Diabetes mellitus ([place]) 12/09/2011  . Overweight(278.02) 12/09/2011  . S/P hysterectomy  12/09/2011  . Vitiligo 12/09/2011    Past Surgical History:  Procedure Laterality Date  . BIOPSY  06/17/2019   Procedure: BIOPSY;  Surgeon: Thornton Park, MD;  Location: [facility];  Service: Gastroenterology;;  . COLONOSCOPY  12/19/2015   Mild sigmoid diverticulosis. Small internal hemorrhoids  . ESOPHAGOGASTRODUODENOSCOPY  10/31/2015   Normal EGD  . ESOPHAGOGASTRODUODENOSCOPY (EGD) WITH PROPOFOL N/A 06/17/2019   Procedure: ESOPHAGOGASTRODUODENOSCOPY (EGD) WITH PROPOFOL;  Surgeon: Thornton Park, MD;  Location: [facility];  Service: Gastroenterology;  Laterality: N/A;  . LITHOTRIPSY  01/2014  . PARTIAL HYSTERECTOMY  2002     OB History   No obstetric history on file.     Family History  Problem Relation Age of Onset  . Colon cancer Father   . Rectal cancer Neg Hx     Social History   Tobacco Use  . Smoking status: Never Smoker  . Smokeless tobacco: Never Used  Substance Use Topics  . Alcohol use: No  . Drug use: No    Home Medications Prior to Admission medications   Medication Sig Start Date End Date Taking? Authorizing Provider  acetaminophen (TYLENOL) 500 MG tablet Take 500-1,000 mg by mouth every 6 (six) hours as needed for mild pain or headache.    [provider]  amLODipine (NORVASC) 10 MG tablet Take 10 mg by mouth daily.    [provider]  BD PEN NEEDLE NANO 2ND GEN 32G X 4 MM MISC as directed.  use as directed 03/15/19   [provider]  Blood Glucose Monitoring Suppl (FIFTY50 GLUCOSE METER 2.0) w/Device KIT as directed. 11/22/18   [provider]  ferrous sulfate 325 (65 FE) MG tablet Take 325 mg by mouth daily with breakfast.    [provider]  gabapentin (NEURONTIN) 300 MG capsule Take 300-600 mg by mouth See admin instructions. Take 300 mg by mouth in the morning, 300 mg at 2 PM, and 600 mg at bedtime 03/10/19   [provider]  Insulin Detemir (LEVEMIR FLEXTOUCH) 100 UNIT/ML Pen Inject 22-30  Units into the skin See admin instructions. Inject 30 units into the skin in the morning before breakfast and 23 units at bedtime    [provider]  latanoprost (XALATAN) 0.005 % ophthalmic solution Place 1 drop into both eyes 2 (two) times daily.    [provider]  levothyroxine (SYNTHROID, LEVOTHROID) 88 MCG tablet Take 88 mcg by mouth daily.    [provider]  meloxicam (MOBIC) 7.5 MG tablet Take 7.5 mg by mouth as needed for pain.    [provider]  ondansetron (ZOFRAN) 4 MG tablet Take 1 tablet (4 mg total) by mouth every 6 (six) hours as needed for nausea. 06/19/19   Cristal Deer, MD  pantoprazole (PROTONIX) 40 MG tablet Take 1 tablet (40 mg total) by mouth 2 (two) times daily. 06/19/19   Cristal Deer, MD  sucralfate (CARAFATE) 1 GM/10ML suspension Take 10 mLs (1 g total) by mouth 4 (four) times daily -  with meals and at bedtime. 06/19/19   Cristal Deer, MD  timolol (TIMOPTIC) 0.5 % ophthalmic solution Place 1 drop into both eyes 2 (two) times daily.  03/05/19   [provider]    Allergies    Penicillins  Review of Systems   Review of Systems  Constitutional: Negative for appetite change, chills and fever.  HENT: Negative for ear pain, rhinorrhea, sneezing and sore throat.   Eyes: Negative for photophobia and visual disturbance.  Respiratory: Negative for cough, chest tightness, shortness of breath and wheezing.   Cardiovascular: Negative for chest pain and palpitations.  Gastrointestinal: Positive for abdominal pain, blood in stool, diarrhea, nausea and vomiting. Negative for constipation.  Genitourinary: Negative for dysuria, hematuria and urgency.  Musculoskeletal: Negative for myalgias.  Skin: Negative for rash.  Neurological: Negative for dizziness, weakness and light-headedness.    Physical Exam Updated Vital Signs BP (!) 114/51   Pulse (!) 107   Temp 98.4 F (36.9 C) (Oral)   Resp 13   SpO2 99%   Physical  Exam Vitals and nursing note reviewed. Exam conducted with a chaperone present.  Constitutional:      General: She is not in acute distress.    Appearance: She is well-developed.  HENT:     Head: Normocephalic and atraumatic.     Nose: Nose normal.  Eyes:     General: No scleral icterus.       Left eye: No discharge.     Conjunctiva/sclera: Conjunctivae normal.  Cardiovascular:     Rate and Rhythm: Normal rate and regular rhythm.     Heart sounds: Normal heart sounds. No murmur. No friction rub. No gallop.   Pulmonary:     Effort: Pulmonary effort is normal. No respiratory distress.     Breath sounds: Normal breath sounds.  Abdominal:     General: Bowel sounds are normal. There is no distension.     Palpations: Abdomen is soft.  Tenderness: There is abdominal tenderness in the epigastric area. There is no guarding.  Genitourinary:    Rectum: No tenderness.     Comments: Brown stool noted in rectal vault on DRE. No tenderness with examination. Musculoskeletal:        General: Normal range of motion.     Cervical back: Normal range of motion and neck supple.  Skin:    General: Skin is warm and dry.     Findings: No rash.  Neurological:     Mental Status: She is alert.     Motor: No abnormal muscle tone.     Coordination: Coordination normal.     ED Results / Procedures / Treatments   Labs (all labs ordered are listed, but only abnormal results are displayed) Labs Reviewed  COMPREHENSIVE METABOLIC PANEL - Abnormal; Notable for the following components:      Result Value   CO2 20 (*)    Glucose, Bld 334 (*)    Creatinine, Ser 1.64 (*)    Calcium 10.8 (*)    Alkaline Phosphatase 142 (*)    GFR calc non Af Amer 33 (*)    GFR calc Af Amer 39 (*)    All other components within normal limits  URINALYSIS, ROUTINE W REFLEX MICROSCOPIC - Abnormal; Notable for the following components:   APPearance HAZY (*)    Glucose, UA >=500 (*)    Hgb urine dipstick MODERATE (*)     Ketones, ur 5 (*)    Protein, ur 100 (*)    Leukocytes,Ua LARGE (*)    WBC, UA >50 (*)    Bacteria, UA RARE (*)    All other components within normal limits  CBG MONITORING, ED - Abnormal; Notable for the following components:   Glucose-Capillary 318 (*)    All other components within normal limits  URINE CULTURE  CBC  LIPASE, BLOOD  POC OCCULT BLOOD, ED  TYPE AND SCREEN  ABO/RH    EKG EKG Interpretation  Date/Time:  Saturday October 01 2019 16:47:52 EST Ventricular Rate:  101 PR Interval:  172 QRS Duration: 78 QT Interval:  334 QTC Calculation: 433 R Axis:   37 Text Interpretation: Sinus tachycardia ST & T wave abnormality, consider lateral ischemia Abnormal ECG No significant change since last tracing Confirmed by Fredia Sorrow [phone]) on 10/01/2019 8:40:41 PM   Radiology No results found.  Procedures Procedures (including critical care time)  Medications Ordered in ED Medications  sodium chloride flush (NS) 0.9 % injection 3 mL (has no administration in time range)  ondansetron (ZOFRAN) injection 4 mg (has no administration in time range)  famotidine (PEPCID) IVPB 20 mg premix (has no administration in time range)  sodium chloride 0.9 % bolus 1,000 mL (has no administration in time range)    ED Course  I have reviewed the triage vital signs and the nursing notes.  Pertinent labs & imaging results that were available during my care of the patient were reviewed by me and considered in my medical decision making (see chart for details).  Clinical Course as of Sep 30 2101  Sat Oct 01, 2019  2024 Creatinine(!): 1.64 [HK]  2027 Glucose(!): 334 [HK]  2027 Fecal Occult Blood, POC: NEGATIVE [HK]    Clinical Course User Index [HK] Delia Heady, PA-C   MDM Rules/Calculators/A&P                      64 year old female with a past medical history of hypertension, hyperlipidemia, IBS-D,  diabetes, GERD, gastritis/esophagitis presenting to the ED with epigastric  abdominal pain, emesis and dark stools. Reports symptoms beginning 2 days ago. Had a dark tarry bowel movement yesterday. Today when she vomited felt like she tasted blood. She has been taking Protonix as prescribed. Of note, chart review shows that her last EGD was November 2020 showing esophagitis and last colonoscopy was September 2020 showing polyps and nonbleeding internal hemorrhoids. On my exam there are tender palpation in the epigastric area without rebound or guarding. She denies any chest pain or shortness of breath. Rectal exam without any tenderness other external abnormalities. Work-up here significant for normal hemoglobin and hematocrit, urinalysis with large leukocytes, rare bacteria, pyuria. CMP with a creatinine of 1.6 and GFR in the 30s which is similar to prior. No significant elevation in BUN from baseline. Lipase unremarkable. Hemoccult is negative. I personally reviewed and interpreted all labwork and imaging at today's visit. Will obtain CT of abdomen pelvis without contrast to rule out any other causes of her epigastric pain. She cannot recall if she is having any urinary symptoms but would like to be treated for UTI. Care handed off to oncoming provider pending disposition. If patient reports improvement in symptoms, can potentially d/c home.   Final Clinical Impression(s) / ED Diagnoses Final diagnoses:  Lower urinary tract infectious disease  Hyperglycemia    Rx / DC Orders ED Discharge Orders    None     Portions of this note were generated with Dragon dictation software. Dictation errors may occur despite best attempts at proofreading.    Delia Heady, PA-C 10/01/19 2103    Fredia Sorrow, MD 10/02/19 1153

## 2019-10-01 NOTE — ED Notes (Signed)
Pt spouse requesting update

## 2019-10-01 NOTE — ED Notes (Signed)
To ct

## 2019-10-01 NOTE — ED Notes (Signed)
2 unsuccessful attempts to start iv  By 2 rns

## 2019-10-01 NOTE — ED Notes (Signed)
Update given to the pts husband

## 2019-10-14 ENCOUNTER — Encounter: Payer: Self-pay | Admitting: Gastroenterology

## 2019-10-25 ENCOUNTER — Encounter: Payer: Self-pay | Admitting: Gastroenterology

## 2019-10-25 ENCOUNTER — Ambulatory Visit: Payer: Medicare HMO | Admitting: Gastroenterology

## 2019-10-25 ENCOUNTER — Other Ambulatory Visit: Payer: Self-pay

## 2019-10-25 VITALS — BP 112/68 | HR 92 | Temp 97.1°F | Ht 63.0 in | Wt 158.1 lb

## 2019-10-25 DIAGNOSIS — K2100 Gastro-esophageal reflux disease with esophagitis, without bleeding: Secondary | ICD-10-CM | POA: Diagnosis not present

## 2019-10-25 DIAGNOSIS — K580 Irritable bowel syndrome with diarrhea: Secondary | ICD-10-CM | POA: Diagnosis not present

## 2019-10-25 NOTE — Progress Notes (Signed)
Chief Complaint: FU  Referring Provider:  Myrlene Broker, MD      ASSESSMENT AND PLAN;   #1. IBS with diarrhea.  Assoc postcholecystectomy component. Neg stool studies 12/2018. Failed cholestryamine. Neg random colonic Bx for microscopic colitis 04/2019.  #2.  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 FH colon cancer (dad>60).  Next colon due 04/2026.  Earlier, if with any new problems or change in family history.  #3. GERD with EE (EGD 06/2019 Gd D esophagitis, neg SB Bx, neg HP)  #4. Epi pain/N/V (likely d/t UTI-resolved). Neg CT A/P 10/01/2019. Had UTI  Plan:  - Continue protonix 65m po QD, #30, 11 refills. - Nonpharmacologic means of reflux control. - If still with problems, solid phase GES. - FU in 6 months. - She has appt with Dr RUnk Lightning  HPI:    Suzanne SPEISERis a 64y.o. female  For follow-up visit Seen for follow-up ED visit 10/01/2019.  Had UTI.  Also has associated nausea/vomiting/epigastric pain which is much better now.  She is getting frequent UTIs.  She has appointment with Dr. RUnk Lightning She underwent CT Abdo/pelvis on 10/01/2019 which was unremarkable.  Feels much better Minimal diarrhea. No heartburn. No nausea, vomiting, odynophagia or dysphagia. She did lose weight which she has gained back.   Underwent EGD 06/17/2019 which showed LA grade D esophagitis, gastroduodenitis.  She was treated with Protonix 40 mg p.o. twice daily and Carafate 1 g p.o. 4 times daily for 2 weeks.  She is done with Carafate.  Continues on Protonix twice daily.  Seen in ED, Dx with UTI 10/01/2019.  CT Abdo/pelvis was unremarkable.  Past Medical History:  Diagnosis Date  . Diabetes ([place]   . Family history of colon cancer   . GERD (gastroesophageal reflux disease)   . History of colon polyps   . Hyperlipidemia   . Hypertension   . Hypothyroidism   . Iron deficiency anemia   . Irritable bowel syndrome (IBS)    with diarrhea  . Kidney stones   . Trigger index finger  of right hand      Family History  Problem Relation Age of Onset  . Colon cancer Father   . Rectal cancer Neg Hx     Social History   Tobacco Use  . Smoking status: Never Smoker  . Smokeless tobacco: Never Used  Substance Use Topics  . Alcohol use: No  . Drug use: No    Current Outpatient Medications  Medication Sig Dispense Refill  . acetaminophen (TYLENOL) 500 MG tablet Take 500-1,000 mg by mouth every 6 (six) hours as needed for mild pain or headache.    .Marland KitchenamLODipine (NORVASC) 10 MG tablet Take 10 mg by mouth daily.    .Marland Kitchenatorvastatin (LIPITOR) 20 MG tablet 1 tablet daily.    . BD PEN NEEDLE NANO 2ND GEN 32G X 4 MM MISC as directed. use as directed    . Blood Glucose Monitoring Suppl (FIFTY50 GLUCOSE METER 2.0) w/Device KIT as directed.    . ferrous sulfate 325 (65 FE) MG tablet Take 325 mg by mouth daily with breakfast.    . gabapentin (NEURONTIN) 300 MG capsule Take 300-600 mg by mouth See admin instructions. Take 300 mg by mouth every 6 hours, and 600 mg at bedtime    . Insulin Detemir (LEVEMIR FLEXTOUCH) 100 UNIT/ML Pen Inject 22-30 Units into the skin See admin instructions. Inject 30 units into the skin in the morning before breakfast and 22 units  at bedtime    . latanoprost (XALATAN) 0.005 % ophthalmic solution Place 1 drop into both eyes 2 (two) times daily.    Marland Kitchen levothyroxine (SYNTHROID, LEVOTHROID) 88 MCG tablet Take 88 mcg by mouth daily.    . meloxicam (MOBIC) 7.5 MG tablet Take 7.5 mg by mouth as needed for pain.    . pantoprazole (PROTONIX) 40 MG tablet Take 1 tablet (40 mg total) by mouth 2 (two) times daily. (Patient taking differently: Take 40 mg by mouth daily. ) 30 tablet 0  . sucralfate (CARAFATE) 1 GM/10ML suspension Take 10 mLs (1 g total) by mouth 4 (four) times daily -  with meals and at bedtime. 420 mL 0  . timolol (TIMOPTIC) 0.5 % ophthalmic solution Place 1 drop into both eyes 2 (two) times daily.     . ondansetron (ZOFRAN) 4 MG tablet Take 1 tablet (4  mg total) by mouth every 6 (six) hours as needed for nausea. (Patient not taking: Reported on 10/25/2019) 20 tablet 0   No current facility-administered medications for this visit.    Allergies  Allergen Reactions  . Penicillins Hives    Did it involve swelling of the face/tongue/throat, SOB, or low BP? No, hives on the arms Did it involve sudden or severe rash/hives, skin peeling, or any reaction on the inside of your mouth or nose? No Did you need to seek medical attention at a hospital or doctor's office? Yes When did it last happen? "I was 12" If all above answers are "NO", may proceed with cephalosporin use.     Review of Systems:  neg    Physical Exam:    Today's Vitals   10/25/19 1454  BP: 112/68  Pulse: 92  Temp: (!) 97.1 F (36.2 C)  Weight: 158 lb 2 oz (71.7 kg)  Height: 5' 3"  (1.6 m)   Body mass index is 28.01 kg/m. Not examined since it was a televisit  CBC Latest Ref Rng & Units 10/01/2019 06/17/2019 06/16/2019  WBC 4.0 - 10.5 K/uL 9.4 9.9 13.2(H)  Hemoglobin 12.0 - 15.0 g/dL 13.0 12.2 11.1(L)  Hematocrit 36.0 - 46.0 % 40.7 39.5 37.6  Platelets 150 - 400 K/uL 278 216 178   CMP Latest Ref Rng & Units 10/01/2019 06/19/2019 06/18/2019  Glucose 70 - 99 mg/dL 334(H) 162(H) 109(H)  BUN 8 - 23 mg/dL 22 15 17   Creatinine 0.44 - 1.00 mg/dL 1.64(H) 1.56(H) 1.66(H)  Sodium 135 - 145 mmol/L 142 143 147(H)  Potassium 3.5 - 5.1 mmol/L 4.3 3.5 3.4(L)  Chloride 98 - 111 mmol/L 110 110 113(H)  CO2 22 - 32 mmol/L 20(L) 25 26  Calcium 8.9 - 10.3 mg/dL 10.8(H) 8.6(L) 8.8(L)  Total Protein 6.5 - 8.1 g/dL 8.0 - -  Total Bilirubin 0.3 - 1.2 mg/dL 0.5 - -  Alkaline Phos 38 - 126 U/L 142(H) - -  AST 15 - 41 U/L 20 - -  ALT 0 - 44 U/L 24 - -  CT report was given to the patient CT was reviewed independently.   Carmell Austria, MD 10/25/2019, 3:09 PM  Cc: Myrlene Broker, MD

## 2019-10-25 NOTE — Patient Instructions (Signed)
If you are age 62 or older, your body mass index should be between 23-30. Your Body mass index is 28.01 kg/m. If this is out of the aforementioned range listed, please consider follow up with your Primary Care Provider.  If you are age 78 or younger, your body mass index should be between 19-25. Your Body mass index is 28.01 kg/m. If this is out of the aformentioned range listed, please consider follow up with your Primary Care Provider.   Follow up in 6 months.   Thank you,  Dr. Jackquline Denmark

## 2020-04-20 IMAGING — CT CT ABD-PELV W/O CM
2 of 4 series · 16 of 46 positions shown, 18 images · non-contrast
Comparison: None.

CLINICAL DATA: Abdominal pain, epigastric pain

EXAM:
CT ABDOMEN AND PELVIS WITHOUT CONTRAST
TECHNIQUE: Multidetector CT imaging of the abdomen and pelvis was performed
following the standard protocol without IV contrast.

[Series 3: abd/ pelvis 5.0 i30f 2 · axial · 0.85mm/px · z∈[+743,+1178]mm · 13 of 95 slices shown, 15 images]
[im 4/95  soft-tissue]
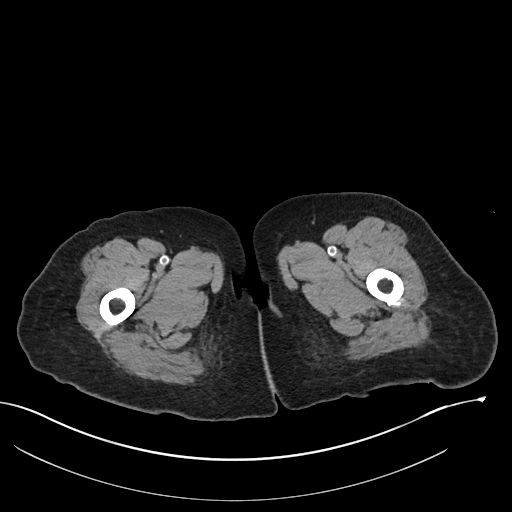
[im 4/95  bone]
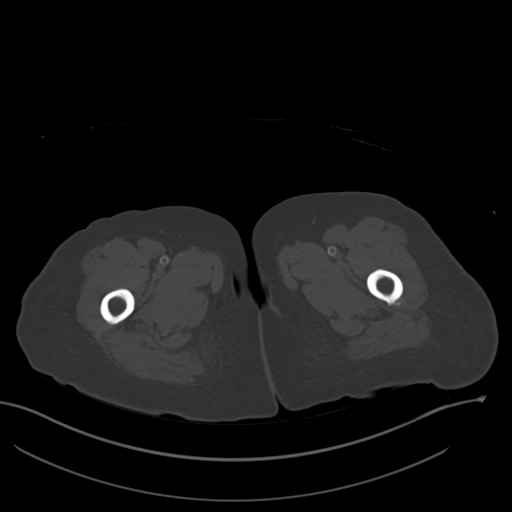
[im 12/95  soft-tissue]
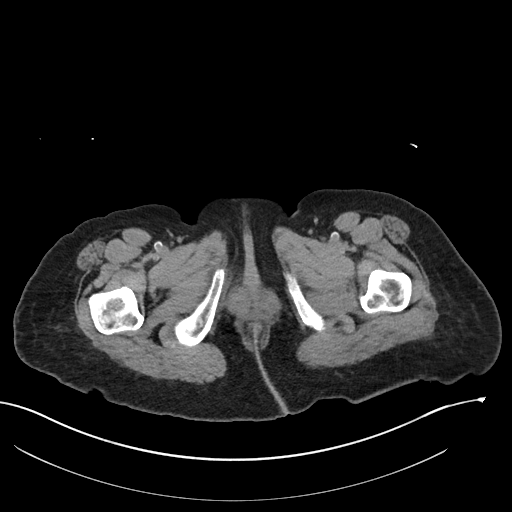
[im 19/95  soft-tissue]
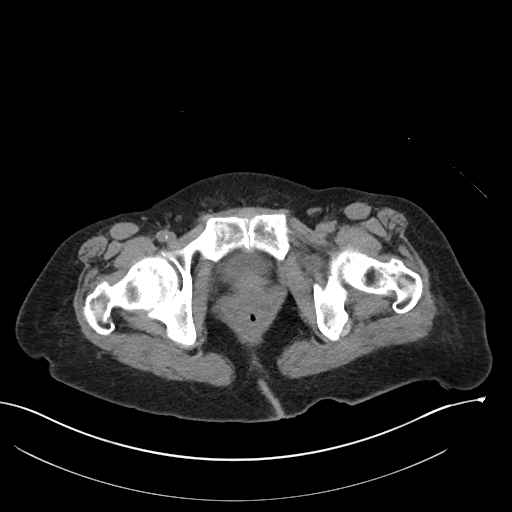
[im 27/95  soft-tissue]
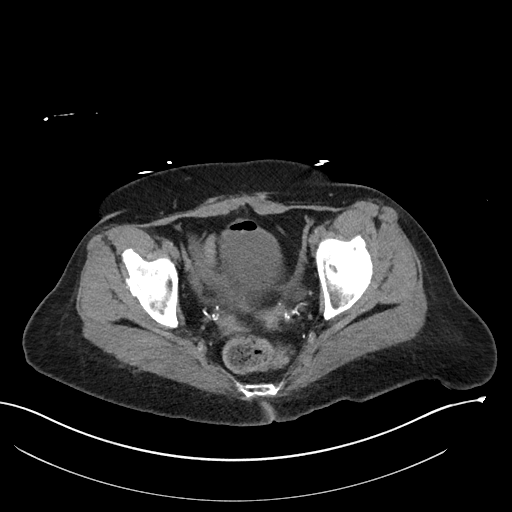
[im 34/95  soft-tissue]
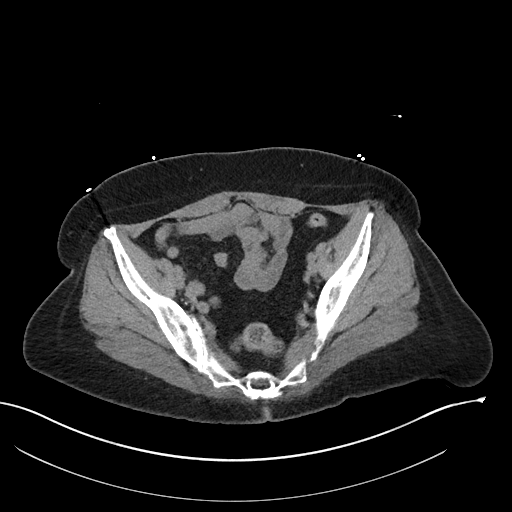
[im 42/95  soft-tissue]
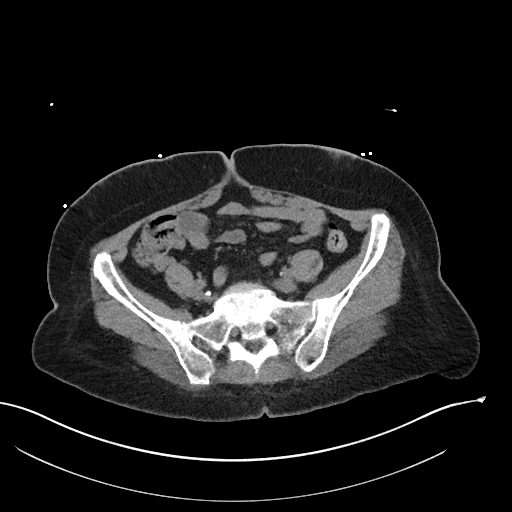
[im 49/95  soft-tissue]
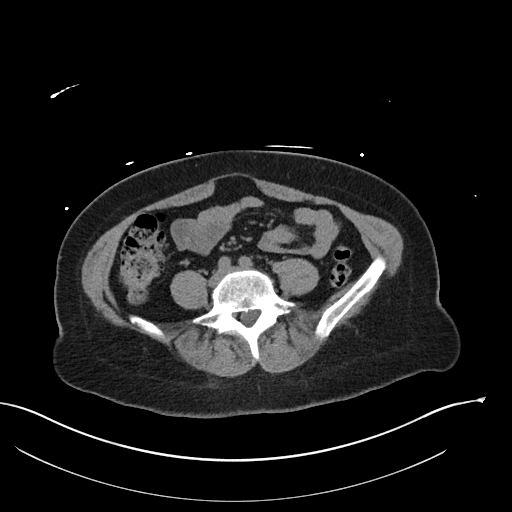
[im 53/95  soft-tissue]
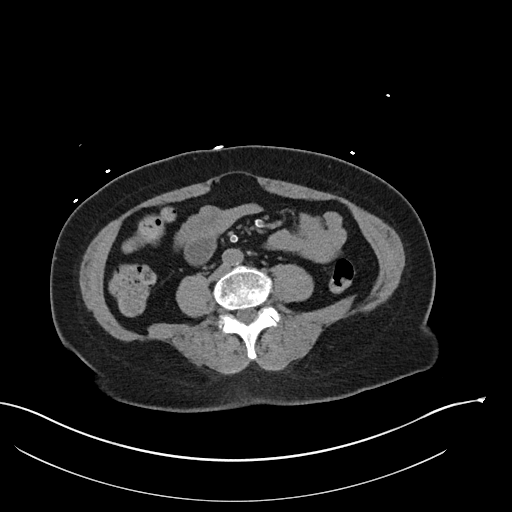
[im 61/95  soft-tissue]
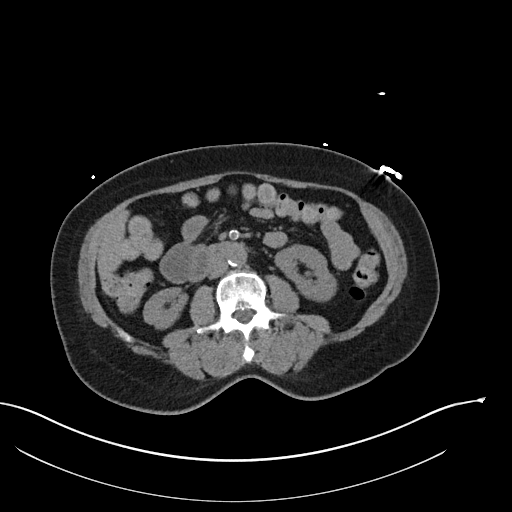
[im 61/95  bone]
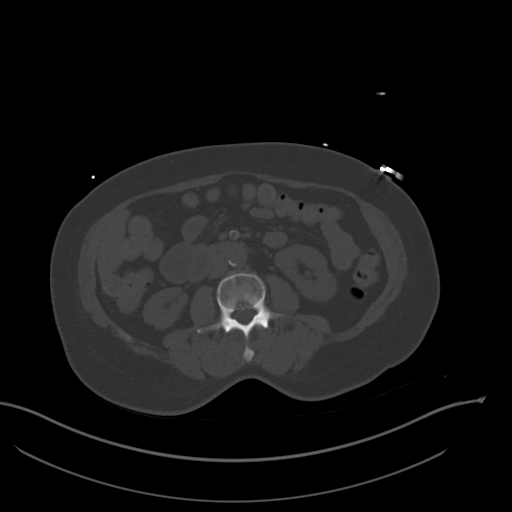
[im 68/95  soft-tissue]
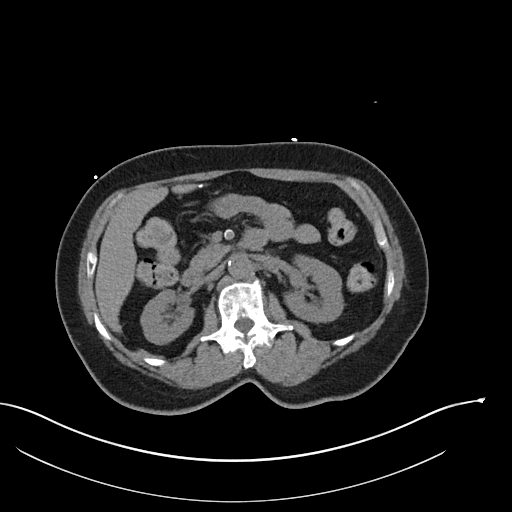
[im 76/95  soft-tissue]
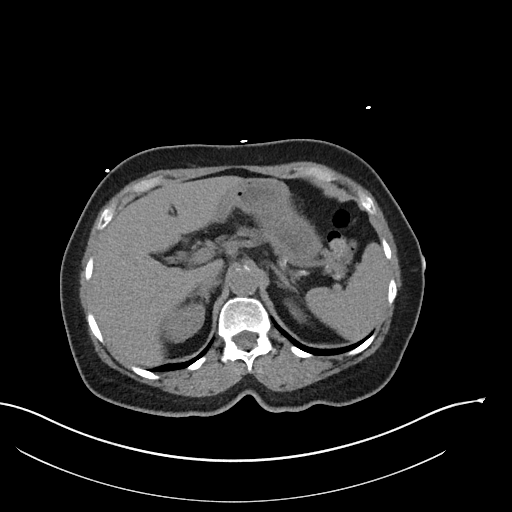
[im 83/95  soft-tissue]
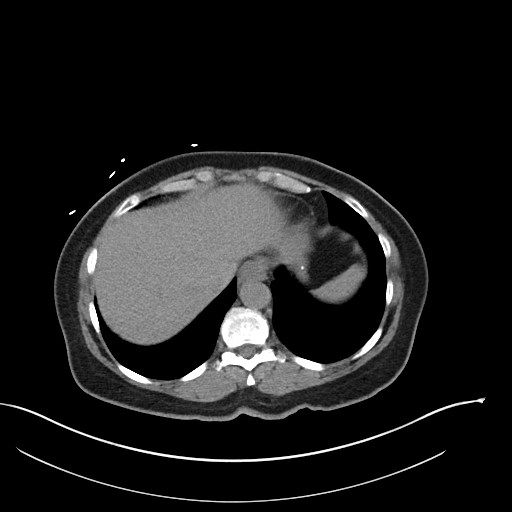
[im 91/95  soft-tissue]
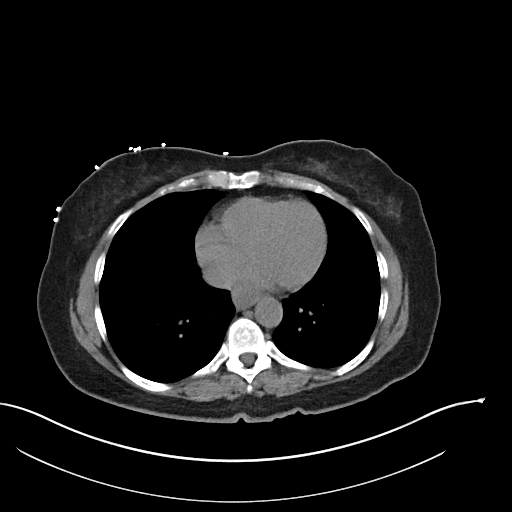

[Series 6: cor st · coronal · 0.72mm/px · 3 of 86 slices shown]
[im 29/86  soft-tissue]
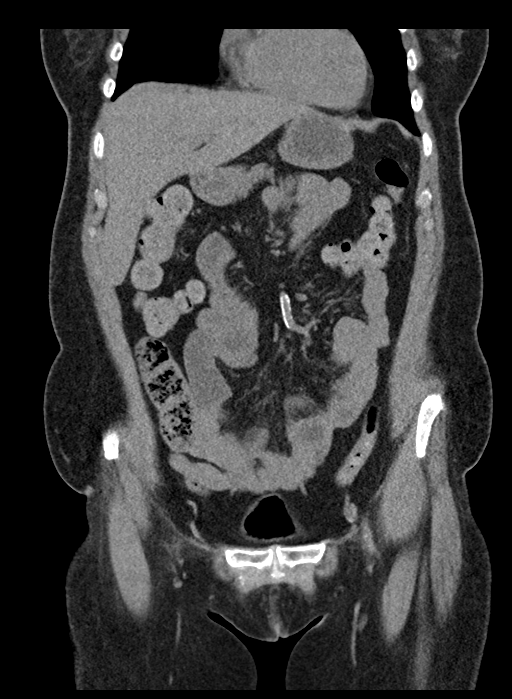
[im 38/86  soft-tissue]
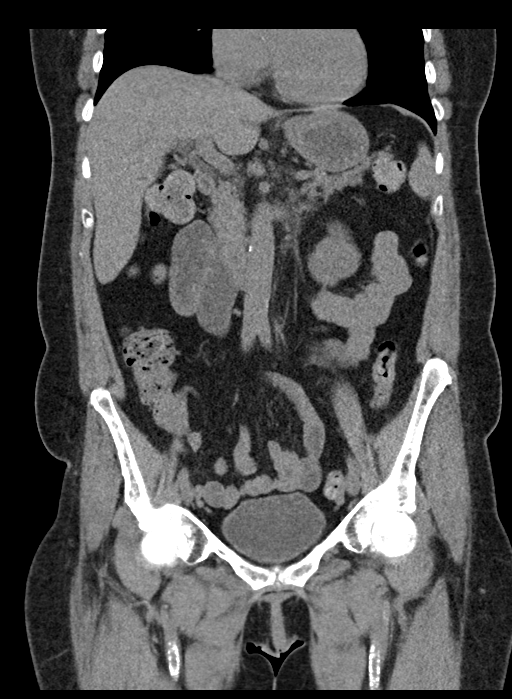
[im 48/86  soft-tissue]
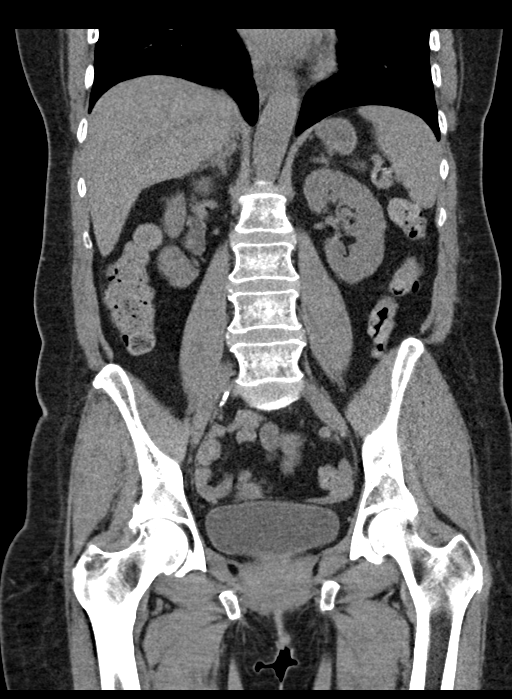

[16 of 46 positions shown; findings below may reference images not displayed]

FINDINGS: Lower chest: Lung bases are clear. No effusions. Heart is normal
size.

Hepatobiliary: No focal liver abnormality is seen. Status post
cholecystectomy. No biliary dilatation.

Pancreas: No focal abnormality or ductal dilatation.

Spleen: No focal abnormality.  Normal size.

Adrenals/Urinary Tract: Gas within the urinary bladder, presumably
from recent catheterization. No renal or adrenal mass. No
hydronephrosis. Punctate nonobstructing stone in the mid to lower
pole of the right kidney.

Stomach/Bowel: Normal appendix. Stomach, large and small bowel
grossly unremarkable.

Vascular/Lymphatic: Aortic and branch vessel atherosclerosis. No
aneurysm or adenopathy.

Reproductive: Prior hysterectomy.  No adnexal masses.

Other: No free fluid or free air.

Musculoskeletal: No acute bony abnormality.
IMPRESSION: No acute findings in the abdomen or pelvis.

Aortic atherosclerosis.

Gas in the urinary bladder, presumably from recent catheterization.

## 2021-03-12 ENCOUNTER — Inpatient Hospital Stay (HOSPITAL_COMMUNITY)
Admission: EM | Admit: 2021-03-12 | Discharge: 2021-03-17 | DRG: 074 | Disposition: A | Discharge status: Home / Self Care | Payer: Medicare HMO | Attending: Internal Medicine | Admitting: Internal Medicine

## 2021-03-12 ENCOUNTER — Encounter (HOSPITAL_COMMUNITY): Payer: Self-pay | Admitting: Emergency Medicine

## 2021-03-12 ENCOUNTER — Other Ambulatory Visit: Payer: Self-pay

## 2021-03-12 DIAGNOSIS — E1165 Type 2 diabetes mellitus with hyperglycemia: Secondary | ICD-10-CM | POA: Diagnosis present

## 2021-03-12 DIAGNOSIS — E872 Acidosis: Secondary | ICD-10-CM | POA: Diagnosis present

## 2021-03-12 DIAGNOSIS — E1143 Type 2 diabetes mellitus with diabetic autonomic (poly)neuropathy: Secondary | ICD-10-CM | POA: Diagnosis not present

## 2021-03-12 DIAGNOSIS — N179 Acute kidney failure, unspecified: Secondary | ICD-10-CM | POA: Diagnosis present

## 2021-03-12 DIAGNOSIS — E039 Hypothyroidism, unspecified: Secondary | ICD-10-CM | POA: Diagnosis present

## 2021-03-12 DIAGNOSIS — E1122 Type 2 diabetes mellitus with diabetic chronic kidney disease: Secondary | ICD-10-CM | POA: Diagnosis present

## 2021-03-12 DIAGNOSIS — Z20822 Contact with and (suspected) exposure to covid-19: Secondary | ICD-10-CM | POA: Diagnosis present

## 2021-03-12 DIAGNOSIS — D509 Iron deficiency anemia, unspecified: Secondary | ICD-10-CM | POA: Diagnosis present

## 2021-03-12 DIAGNOSIS — Z90711 Acquired absence of uterus with remaining cervical stump: Secondary | ICD-10-CM

## 2021-03-12 DIAGNOSIS — E785 Hyperlipidemia, unspecified: Secondary | ICD-10-CM | POA: Diagnosis present

## 2021-03-12 DIAGNOSIS — Z9049 Acquired absence of other specified parts of digestive tract: Secondary | ICD-10-CM

## 2021-03-12 DIAGNOSIS — K3184 Gastroparesis: Secondary | ICD-10-CM | POA: Diagnosis present

## 2021-03-12 DIAGNOSIS — E860 Dehydration: Secondary | ICD-10-CM | POA: Diagnosis present

## 2021-03-12 DIAGNOSIS — R112 Nausea with vomiting, unspecified: Secondary | ICD-10-CM | POA: Diagnosis not present

## 2021-03-12 DIAGNOSIS — Z79899 Other long term (current) drug therapy: Secondary | ICD-10-CM

## 2021-03-12 DIAGNOSIS — Z8719 Personal history of other diseases of the digestive system: Secondary | ICD-10-CM

## 2021-03-12 DIAGNOSIS — Z880 Allergy status to penicillin: Secondary | ICD-10-CM

## 2021-03-12 DIAGNOSIS — K922 Gastrointestinal hemorrhage, unspecified: Secondary | ICD-10-CM | POA: Diagnosis present

## 2021-03-12 DIAGNOSIS — K2100 Gastro-esophageal reflux disease with esophagitis, without bleeding: Secondary | ICD-10-CM | POA: Diagnosis present

## 2021-03-12 DIAGNOSIS — N1832 Chronic kidney disease, stage 3b: Secondary | ICD-10-CM | POA: Diagnosis present

## 2021-03-12 DIAGNOSIS — I129 Hypertensive chronic kidney disease with stage 1 through stage 4 chronic kidney disease, or unspecified chronic kidney disease: Secondary | ICD-10-CM | POA: Diagnosis present

## 2021-03-12 DIAGNOSIS — E1142 Type 2 diabetes mellitus with diabetic polyneuropathy: Secondary | ICD-10-CM | POA: Diagnosis present

## 2021-03-12 DIAGNOSIS — K209 Esophagitis, unspecified without bleeding: Secondary | ICD-10-CM | POA: Diagnosis present

## 2021-03-12 DIAGNOSIS — E1169 Type 2 diabetes mellitus with other specified complication: Secondary | ICD-10-CM | POA: Diagnosis present

## 2021-03-12 DIAGNOSIS — E119 Type 2 diabetes mellitus without complications: Secondary | ICD-10-CM

## 2021-03-12 DIAGNOSIS — Z87442 Personal history of urinary calculi: Secondary | ICD-10-CM

## 2021-03-12 DIAGNOSIS — Z803 Family history of malignant neoplasm of breast: Secondary | ICD-10-CM

## 2021-03-12 DIAGNOSIS — Z8249 Family history of ischemic heart disease and other diseases of the circulatory system: Secondary | ICD-10-CM

## 2021-03-12 LAB — CBC WITH DIFFERENTIAL/PLATELET
Abs Immature Granulocytes: 0.03 10*3/uL (ref 0.00–0.07)
Basophils Absolute: 0.1 10*3/uL (ref 0.0–0.1)
Basophils Relative: 1 %
Eosinophils Absolute: 0.1 10*3/uL (ref 0.0–0.5)
Eosinophils Relative: 1 %
HCT: 39.3 % (ref 36.0–46.0)
Hemoglobin: 12.1 g/dL (ref 12.0–15.0)
Immature Granulocytes: 0 %
Lymphocytes Relative: 19 %
Lymphs Abs: 1.7 10*3/uL (ref 0.7–4.0)
MCH: 25.6 pg — ABNORMAL LOW (ref 26.0–34.0)
MCHC: 30.8 g/dL (ref 30.0–36.0)
MCV: 83.3 fL (ref 80.0–100.0)
Monocytes Absolute: 0.5 10*3/uL (ref 0.1–1.0)
Monocytes Relative: 5 %
Neutro Abs: 6.5 10*3/uL (ref 1.7–7.7)
Neutrophils Relative %: 74 %
Platelets: 324 10*3/uL (ref 150–400)
RBC: 4.72 MIL/uL (ref 3.87–5.11)
RDW: 13.7 % (ref 11.5–15.5)
WBC: 8.8 10*3/uL (ref 4.0–10.5)
nRBC: 0 % (ref 0.0–0.2)

## 2021-03-12 LAB — COMPREHENSIVE METABOLIC PANEL
ALT: 20 U/L (ref 0–44)
AST: 22 U/L (ref 15–41)
Albumin: 4 g/dL (ref 3.5–5.0)
Alkaline Phosphatase: 123 U/L (ref 38–126)
Anion gap: 12 (ref 5–15)
BUN: 26 mg/dL — ABNORMAL HIGH (ref 8–23)
CO2: 19 mmol/L — ABNORMAL LOW (ref 22–32)
Calcium: 10.3 mg/dL (ref 8.9–10.3)
Chloride: 113 mmol/L — ABNORMAL HIGH (ref 98–111)
Creatinine, Ser: 1.58 mg/dL — ABNORMAL HIGH (ref 0.44–1.00)
GFR, Estimated: 37 mL/min — ABNORMAL LOW (ref >60)
Glucose, Bld: 143 mg/dL — ABNORMAL HIGH (ref 70–99)
Potassium: 3.7 mmol/L (ref 3.5–5.1)
Sodium: 144 mmol/L (ref 135–145)
Total Bilirubin: 0.8 mg/dL (ref 0.3–1.2)
Total Protein: 8 g/dL (ref 6.5–8.1)

## 2021-03-12 LAB — LIPASE, BLOOD: Lipase: 28 U/L (ref 11–51)

## 2021-03-12 MED ORDER — PANTOPRAZOLE INFUSION (NEW) - SIMPLE MED
8.0000 mg/h | INTRAVENOUS | Status: DC
  Administered 2021-03-13 – 2021-03-15 (×7): 8 mg/h via INTRAVENOUS
  Filled 2021-03-12 (×3): qty 80
  Filled 2021-03-12: qty 100
  Filled 2021-03-12: qty 80
  Filled 2021-03-12: qty 100
  Filled 2021-03-12: qty 80

## 2021-03-12 MED ORDER — PANTOPRAZOLE SODIUM 40 MG IV SOLR: 40.0000 mg | Freq: Two times a day (BID) | INTRAVENOUS | Status: DC

## 2021-03-12 MED ORDER — PANTOPRAZOLE 80MG IVPB - SIMPLE MED
80.0000 mg | Freq: Once | INTRAVENOUS | Status: AC
  Administered 2021-03-13: 80 mg via INTRAVENOUS
  Filled 2021-03-12: qty 80

## 2021-03-12 NOTE — ED Triage Notes (Signed)
Pt c/o coffee ground emesis x3 starting today with epigastric pain x3 days. Denies blood thinners.

## 2021-03-12 NOTE — ED Provider Notes (Signed)
Emergency Medicine Provider Triage Evaluation Note  Suzanne Pierce , a 65 y.o. female  was evaluated in triage.  Pt complains of nv abd pain and hematemesis.  Review of Systems  Positive: Nv, abd pain, hematemesis Negative: Diarrhea, chest pain, sob  Physical Exam  BP 128/81 (BP Location: Right Arm)   Pulse 99   Temp 99 F (37.2 C) (Oral)   Resp 17   SpO2 100%  Gen:   Awake, no distress   Resp:  Normal effort  MSK:   Moves extremities without difficulty  Other:  Epigastric ttp  Medical Decision Making  Medically screening exam initiated at 7:59 PM.  Appropriate orders placed.  Suzanne Pierce was informed that the remainder of the evaluation will be completed by another provider, this initial triage assessment does not replace that evaluation, and the importance of remaining in the ED until their evaluation is complete.     Bishop Dublin 03/12/21 Birdie Sons, MD 03/12/21 2144

## 2021-03-12 NOTE — ED Provider Notes (Addendum)
[facility] EMERGENCY DEPARTMENT Provider Note   CSN: [id number] Arrival date & time: 03/12/21  1912     History Chief Complaint  Patient presents with   Hematemesis    Suzanne Pierce is a 65 y.o. female.  The history is provided by the patient.  Emesis Severity:  Moderate Duration: 3 pm. Timing:  Sporadic Quality:  Coffee grounds Progression:  Unchanged Chronicity:  Recurrent Recent urination:  Normal Context: not post-tussive   Relieved by:  Nothing Worsened by:  Nothing Ineffective treatments:  None tried Associated symptoms: no diarrhea, no fever and no URI   Risk factors: no prior abdominal surgery   Patient with GERD, gastritis and esophagitis presents with several episodes of hematemesis. Does not have a GI specialist.      Past Medical History:  Diagnosis Date   Diabetes ([place])    Family history of colon cancer    GERD (gastroesophageal reflux disease)    History of colon polyps    Hyperlipidemia    Hypertension    Hypothyroidism    Iron deficiency anemia    Irritable bowel syndrome (IBS)    with diarrhea   Kidney stones    Trigger index finger of right hand     Patient Active Problem List   Diagnosis Date Noted   Acute esophagitis    Type 2 diabetes mellitus with hyperlipidemia ([place]) 06/16/2019   Abdominal pain, chronic, epigastric    Nausea and/or vomiting 06/15/2019   Hyperglycemia 06/15/2019   Dehydration 06/15/2019   ARF (acute renal failure) ([place]) 06/15/2019   Diabetes mellitus ([place]) 12/09/2011   Overweight(278.02) 12/09/2011   S/P hysterectomy 12/09/2011   Vitiligo 12/09/2011    Past Surgical History:  Procedure Laterality Date   BIOPSY  06/17/2019   Procedure: BIOPSY;  Surgeon: Thornton Park, MD;  Location: [place];  Service: Gastroenterology;;   COLONOSCOPY  12/19/2015   Mild sigmoid diverticulosis. Small internal hemorrhoids   ESOPHAGOGASTRODUODENOSCOPY  10/31/2015   Normal EGD    ESOPHAGOGASTRODUODENOSCOPY (EGD) WITH PROPOFOL N/A 06/17/2019   Procedure: ESOPHAGOGASTRODUODENOSCOPY (EGD) WITH PROPOFOL;  Surgeon: Thornton Park, MD;  Location: [place];  Service: Gastroenterology;  Laterality: N/A;   LITHOTRIPSY  01/2014   PARTIAL HYSTERECTOMY  2002     OB History   No obstetric history on file.     Family History  Problem Relation Age of Onset   Colon cancer Father    Rectal cancer Neg Hx     Social History   Tobacco Use   Smoking status: Never   Smokeless tobacco: Never  Vaping Use   Vaping Use: Never used  Substance Use Topics   Alcohol use: No   Drug use: No    Home Medications Prior to Admission medications   Medication Sig Start Date End Date Taking? Authorizing Provider  acetaminophen (TYLENOL) 500 MG tablet Take 500-1,000 mg by mouth every 6 (six) hours as needed for mild pain or headache.    [provider]  amLODipine (NORVASC) 10 MG tablet Take 10 mg by mouth daily.    [provider]  atorvastatin (LIPITOR) 20 MG tablet 1 tablet daily. 10/07/19   [provider]  BD PEN NEEDLE NANO 2ND GEN 32G X 4 MM MISC as directed. use as directed 03/15/19   [provider]  Blood Glucose Monitoring Suppl (FIFTY50 GLUCOSE METER 2.0) w/Device KIT as directed. 11/22/18   [provider]  ferrous sulfate 325 (65 FE) MG tablet Take 325 mg by mouth daily  with breakfast.    [provider]  gabapentin (NEURONTIN) 300 MG capsule Take 300-600 mg by mouth See admin instructions. Take 300 mg by mouth every 6 hours, and 600 mg at bedtime 03/10/19   [provider]  Insulin Detemir (LEVEMIR FLEXTOUCH) 100 UNIT/ML Pen Inject 22-30 Units into the skin See admin instructions. Inject 30 units into the skin in the morning before breakfast and 22 units at bedtime    [provider]  latanoprost (XALATAN) 0.005 % ophthalmic solution Place 1 drop into both eyes 2 (two) times daily.    [provider]  levothyroxine (SYNTHROID, LEVOTHROID) 88 MCG tablet Take 88 mcg by mouth daily.    [provider]  meloxicam (MOBIC) 7.5 MG tablet Take 7.5 mg by mouth as needed for pain.    [provider]  ondansetron (ZOFRAN) 4 MG tablet Take 1 tablet (4 mg total) by mouth every 6 (six) hours as needed for nausea. Patient not taking: Reported on 10/25/2019 06/19/19   Cristal Deer, MD  pantoprazole (PROTONIX) 40 MG tablet Take 1 tablet (40 mg total) by mouth 2 (two) times daily. Patient taking differently: Take 40 mg by mouth daily.  06/19/19   Cristal Deer, MD  sucralfate (CARAFATE) 1 GM/10ML suspension Take 10 mLs (1 g total) by mouth 4 (four) times daily -  with meals and at bedtime. 06/19/19   Cristal Deer, MD  timolol (TIMOPTIC) 0.5 % ophthalmic solution Place 1 drop into both eyes 2 (two) times daily.  03/05/19   [provider]    Allergies    Penicillins  Review of Systems   Review of Systems  Constitutional:  Negative for fever.  HENT:  Negative for facial swelling.   Eyes:  Negative for redness.  Respiratory:  Negative for wheezing.   Cardiovascular:  Negative for leg swelling.  Gastrointestinal:  Positive for nausea and vomiting. Negative for diarrhea.  Genitourinary:  Negative for difficulty urinating.  Skin:  Negative for rash.  Neurological:  Negative for facial asymmetry.  Psychiatric/Behavioral:  Negative for agitation.   All other systems reviewed and are negative.  Physical Exam Updated Vital Signs BP 125/80 (BP Location: Right Arm)   Pulse (!) 106   Temp 99 F (37.2 C) (Oral)   Resp 17   SpO2 100%   Physical Exam Vitals and nursing note reviewed. Exam conducted with a chaperone present.  Constitutional:      General: She is not in acute distress.    Appearance: Normal appearance.  HENT:     Head: Normocephalic and atraumatic.     Nose: Nose normal.  Eyes:     Conjunctiva/sclera: Conjunctivae normal.     Pupils:  Pupils are equal, round, and reactive to light.  Cardiovascular:     Rate and Rhythm: Normal rate and regular rhythm.     Pulses: Normal pulses.     Heart sounds: Normal heart sounds.  Pulmonary:     Effort: Pulmonary effort is normal.     Breath sounds: Normal breath sounds.  Abdominal:     General: Abdomen is flat. Bowel sounds are normal.     Palpations: Abdomen is soft.     Tenderness: There is no abdominal tenderness. There is no guarding or rebound.  Genitourinary:    Comments: No gross blood sent to lab Musculoskeletal:        General: Normal range of motion.     Cervical back: Normal range of motion and neck supple.  Skin:  General: Skin is warm and dry.     Capillary Refill: Capillary refill takes less than 2 seconds.  Neurological:     General: No focal deficit present.     Mental Status: She is alert and oriented to person, place, and time.     Deep Tendon Reflexes: Reflexes normal.  Psychiatric:        Mood and Affect: Mood normal.        Behavior: Behavior normal.    ED Results / Procedures / Treatments   Labs (all labs ordered are listed, but only abnormal results are displayed) Results for orders placed or performed during the hospital encounter of 03/12/21  CBC with Differential  Result Value Ref Range   WBC 8.8 4.0 - 10.5 K/uL   RBC 4.72 3.87 - 5.11 MIL/uL   Hemoglobin 12.1 12.0 - 15.0 g/dL   HCT 39.3 36.0 - 46.0 %   MCV 83.3 80.0 - 100.0 fL   MCH 25.6 (L) 26.0 - 34.0 pg   MCHC 30.8 30.0 - 36.0 g/dL   RDW 13.7 11.5 - 15.5 %   Platelets 324 150 - 400 K/uL   nRBC 0.0 0.0 - 0.2 %   Neutrophils Relative % 74 %   Neutro Abs 6.5 1.7 - 7.7 K/uL   Lymphocytes Relative 19 %   Lymphs Abs 1.7 0.7 - 4.0 K/uL   Monocytes Relative 5 %   Monocytes Absolute 0.5 0.1 - 1.0 K/uL   Eosinophils Relative 1 %   Eosinophils Absolute 0.1 0.0 - 0.5 K/uL   Basophils Relative 1 %   Basophils Absolute 0.1 0.0 - 0.1 K/uL   Immature Granulocytes 0 %   Abs Immature  Granulocytes 0.03 0.00 - 0.07 K/uL  Comprehensive metabolic panel  Result Value Ref Range   Sodium 144 135 - 145 mmol/L   Potassium 3.7 3.5 - 5.1 mmol/L   Chloride 113 (H) 98 - 111 mmol/L   CO2 19 (L) 22 - 32 mmol/L   Glucose, Bld 143 (H) 70 - 99 mg/dL   BUN 26 (H) 8 - 23 mg/dL   Creatinine, Ser 1.58 (H) 0.44 - 1.00 mg/dL   Calcium 10.3 8.9 - 10.3 mg/dL   Total Protein 8.0 6.5 - 8.1 g/dL   Albumin 4.0 3.5 - 5.0 g/dL   AST 22 15 - 41 U/L   ALT 20 0 - 44 U/L   Alkaline Phosphatase 123 38 - 126 U/L   Total Bilirubin 0.8 0.3 - 1.2 mg/dL   GFR, Estimated 37 (L) >60 mL/min   Anion gap 12 5 - 15  Lipase, blood  Result Value Ref Range   Lipase 28 11 - 51 U/L   No results found.  EKG None  Radiology No results found.  Procedures Procedures   Medications Ordered in ED Medications  pantoprazole (PROTONIX) 80 mg /NS 100 mL IVPB (has no administration in time range)  pantoprozole (PROTONIX) 80 mg /NS 100 mL infusion (has no administration in time range)  pantoprazole (PROTONIX) injection 40 mg (has no administration in time range)    ED Course  I have reviewed the triage vital signs and the nursing notes.  Pertinent labs & imaging results that were available during my care of the patient were reviewed by me and considered in my medical decision making (see chart for details).    MDM Reviewed: previous chart and vitals Interpretation: labs and x-ray Total time providing critical care: 30-74 minutes. This excludes time spent performing separately reportable procedures  and services. Consults: admitting MD and gastrointestinal (secure chat sent to Dr. Bryan Lemma) CRITICAL CARE Performed by: Coltyn Hanning K Ysabela Keisler-Rasch Total critical care time: 60 minutes Critical care time was exclusive of separately billable procedures and treating other patients. Critical care was necessary to treat or prevent imminent or life-threatening deterioration. Critical care was time spent personally by  me on the following activities: development of treatment plan with patient and/or surrogate as well as nursing, discussions with consultants, evaluation of patient's response to treatment, examination of patient, obtaining history from patient or surrogate, ordering and performing treatments and interventions, ordering and review of laboratory studies, ordering and review of radiographic studies, pulse oximetry and re-evaluation of patient's condition.   Final Clinical Impression(s) / ED Diagnoses Final diagnoses:  None   Admit to medicine  Rx / DC Orders ED Discharge Orders     None        Zia Kanner, MD 03/13/21 0127    Ludie Hudon, MD 03/13/21 [id number]

## 2021-03-13 ENCOUNTER — Emergency Department (HOSPITAL_COMMUNITY): Payer: Medicare HMO

## 2021-03-13 ENCOUNTER — Encounter (HOSPITAL_COMMUNITY): Payer: Self-pay | Admitting: Emergency Medicine

## 2021-03-13 DIAGNOSIS — E039 Hypothyroidism, unspecified: Secondary | ICD-10-CM | POA: Diagnosis present

## 2021-03-13 DIAGNOSIS — K274 Chronic or unspecified peptic ulcer, site unspecified, with hemorrhage: Secondary | ICD-10-CM

## 2021-03-13 DIAGNOSIS — D509 Iron deficiency anemia, unspecified: Secondary | ICD-10-CM | POA: Diagnosis present

## 2021-03-13 DIAGNOSIS — K922 Gastrointestinal hemorrhage, unspecified: Secondary | ICD-10-CM

## 2021-03-13 DIAGNOSIS — E1165 Type 2 diabetes mellitus with hyperglycemia: Secondary | ICD-10-CM | POA: Diagnosis present

## 2021-03-13 DIAGNOSIS — I129 Hypertensive chronic kidney disease with stage 1 through stage 4 chronic kidney disease, or unspecified chronic kidney disease: Secondary | ICD-10-CM | POA: Diagnosis present

## 2021-03-13 DIAGNOSIS — K2100 Gastro-esophageal reflux disease with esophagitis, without bleeding: Secondary | ICD-10-CM | POA: Diagnosis present

## 2021-03-13 DIAGNOSIS — K3184 Gastroparesis: Secondary | ICD-10-CM | POA: Diagnosis present

## 2021-03-13 DIAGNOSIS — R112 Nausea with vomiting, unspecified: Secondary | ICD-10-CM

## 2021-03-13 DIAGNOSIS — E872 Acidosis: Secondary | ICD-10-CM | POA: Diagnosis present

## 2021-03-13 DIAGNOSIS — K920 Hematemesis: Secondary | ICD-10-CM | POA: Diagnosis not present

## 2021-03-13 DIAGNOSIS — K209 Esophagitis, unspecified without bleeding: Secondary | ICD-10-CM | POA: Diagnosis not present

## 2021-03-13 DIAGNOSIS — E1142 Type 2 diabetes mellitus with diabetic polyneuropathy: Secondary | ICD-10-CM | POA: Diagnosis present

## 2021-03-13 DIAGNOSIS — Z20822 Contact with and (suspected) exposure to covid-19: Secondary | ICD-10-CM | POA: Diagnosis present

## 2021-03-13 DIAGNOSIS — Z87442 Personal history of urinary calculi: Secondary | ICD-10-CM | POA: Diagnosis not present

## 2021-03-13 DIAGNOSIS — Z8249 Family history of ischemic heart disease and other diseases of the circulatory system: Secondary | ICD-10-CM | POA: Diagnosis not present

## 2021-03-13 DIAGNOSIS — E119 Type 2 diabetes mellitus without complications: Secondary | ICD-10-CM | POA: Diagnosis not present

## 2021-03-13 DIAGNOSIS — E785 Hyperlipidemia, unspecified: Secondary | ICD-10-CM | POA: Diagnosis present

## 2021-03-13 DIAGNOSIS — N1832 Chronic kidney disease, stage 3b: Secondary | ICD-10-CM | POA: Diagnosis present

## 2021-03-13 DIAGNOSIS — E1122 Type 2 diabetes mellitus with diabetic chronic kidney disease: Secondary | ICD-10-CM | POA: Diagnosis present

## 2021-03-13 DIAGNOSIS — N170 Acute kidney failure with tubular necrosis: Secondary | ICD-10-CM | POA: Diagnosis not present

## 2021-03-13 DIAGNOSIS — Z90711 Acquired absence of uterus with remaining cervical stump: Secondary | ICD-10-CM | POA: Diagnosis not present

## 2021-03-13 DIAGNOSIS — Z79899 Other long term (current) drug therapy: Secondary | ICD-10-CM | POA: Diagnosis not present

## 2021-03-13 DIAGNOSIS — N179 Acute kidney failure, unspecified: Secondary | ICD-10-CM | POA: Diagnosis present

## 2021-03-13 DIAGNOSIS — Z803 Family history of malignant neoplasm of breast: Secondary | ICD-10-CM | POA: Diagnosis not present

## 2021-03-13 DIAGNOSIS — E1143 Type 2 diabetes mellitus with diabetic autonomic (poly)neuropathy: Secondary | ICD-10-CM | POA: Diagnosis present

## 2021-03-13 DIAGNOSIS — Z8719 Personal history of other diseases of the digestive system: Secondary | ICD-10-CM | POA: Diagnosis not present

## 2021-03-13 DIAGNOSIS — E1169 Type 2 diabetes mellitus with other specified complication: Secondary | ICD-10-CM | POA: Diagnosis present

## 2021-03-13 DIAGNOSIS — Z9049 Acquired absence of other specified parts of digestive tract: Secondary | ICD-10-CM | POA: Diagnosis not present

## 2021-03-13 DIAGNOSIS — E860 Dehydration: Secondary | ICD-10-CM | POA: Diagnosis present

## 2021-03-13 DIAGNOSIS — Z880 Allergy status to penicillin: Secondary | ICD-10-CM | POA: Diagnosis not present

## 2021-03-13 DIAGNOSIS — R1013 Epigastric pain: Secondary | ICD-10-CM | POA: Diagnosis not present

## 2021-03-13 LAB — URINALYSIS, ROUTINE W REFLEX MICROSCOPIC
Bilirubin Urine: NEGATIVE
Glucose, UA: NEGATIVE mg/dL
Hgb urine dipstick: NEGATIVE
Ketones, ur: NEGATIVE mg/dL
Nitrite: NEGATIVE
Protein, ur: 100 mg/dL — AB
Specific Gravity, Urine: 1.013 (ref 1.005–1.030)
WBC, UA: >50 WBC/hpf — ABNORMAL HIGH (ref 0–5)
pH: 5 (ref 5.0–8.0)

## 2021-03-13 LAB — RESP PANEL BY RT-PCR (FLU A&B, COVID) ARPGX2
Influenza A by PCR: NEGATIVE
Influenza B by PCR: NEGATIVE
SARS Coronavirus 2 by RT PCR: NEGATIVE

## 2021-03-13 LAB — CBG MONITORING, ED
Glucose-Capillary: 199 mg/dL — ABNORMAL HIGH (ref 70–99)
Glucose-Capillary: 145 mg/dL — ABNORMAL HIGH (ref 70–99)
Glucose-Capillary: 149 mg/dL — ABNORMAL HIGH (ref 70–99)
Glucose-Capillary: 165 mg/dL — ABNORMAL HIGH (ref 70–99)

## 2021-03-13 LAB — CBC
HCT: 39.3 % (ref 36.0–46.0)
Hemoglobin: 12 g/dL (ref 12.0–15.0)
MCH: 25.5 pg — ABNORMAL LOW (ref 26.0–34.0)
MCHC: 30.5 g/dL (ref 30.0–36.0)
MCV: 83.4 fL (ref 80.0–100.0)
Platelets: 307 10*3/uL (ref 150–400)
RBC: 4.71 MIL/uL (ref 3.87–5.11)
RDW: 13.8 % (ref 11.5–15.5)
WBC: 10 10*3/uL (ref 4.0–10.5)
nRBC: 0 % (ref 0.0–0.2)

## 2021-03-13 LAB — PROTIME-INR
INR: 1 (ref 0.8–1.2)
Prothrombin Time: 13.3 seconds (ref 11.4–15.2)

## 2021-03-13 LAB — TYPE AND SCREEN
ABO/RH(D): A POS
Antibody Screen: NEGATIVE

## 2021-03-13 LAB — BASIC METABOLIC PANEL
Anion gap: 13 (ref 5–15)
BUN: 33 mg/dL — ABNORMAL HIGH (ref 8–23)
CO2: 19 mmol/L — ABNORMAL LOW (ref 22–32)
Calcium: 10.6 mg/dL — ABNORMAL HIGH (ref 8.9–10.3)
Chloride: 112 mmol/L — ABNORMAL HIGH (ref 98–111)
Creatinine, Ser: 1.77 mg/dL — ABNORMAL HIGH (ref 0.44–1.00)
GFR, Estimated: 32 mL/min — ABNORMAL LOW (ref >60)
Glucose, Bld: 191 mg/dL — ABNORMAL HIGH (ref 70–99)
Potassium: 4.3 mmol/L (ref 3.5–5.1)
Sodium: 144 mmol/L (ref 135–145)

## 2021-03-13 LAB — MAGNESIUM: Magnesium: 2 mg/dL (ref 1.7–2.4)

## 2021-03-13 LAB — PHOSPHORUS: Phosphorus: 4.6 mg/dL (ref 2.5–4.6)

## 2021-03-13 LAB — HEMOGLOBIN A1C
Hgb A1c MFr Bld: 7.3 % — ABNORMAL HIGH (ref 4.8–5.6)
Mean Plasma Glucose: 162.81 mg/dL

## 2021-03-13 LAB — HIV ANTIBODY (ROUTINE TESTING W REFLEX): HIV Screen 4th Generation wRfx: NONREACTIVE

## 2021-03-13 LAB — GLUCOSE, CAPILLARY
Glucose-Capillary: 126 mg/dL — ABNORMAL HIGH (ref 70–99)
Glucose-Capillary: 132 mg/dL — ABNORMAL HIGH (ref 70–99)

## 2021-03-13 LAB — POC OCCULT BLOOD, ED: Fecal Occult Bld: NEGATIVE

## 2021-03-13 MED ORDER — INSULIN ASPART 100 UNIT/ML IJ SOLN
0.0000 [IU] | INTRAMUSCULAR | Status: DC
  Administered 2021-03-13: 2 [IU] via SUBCUTANEOUS
  Administered 2021-03-13 (×2): 1 [IU] via SUBCUTANEOUS
  Administered 2021-03-13: 2 [IU] via SUBCUTANEOUS
  Administered 2021-03-13 – 2021-03-14 (×2): 1 [IU] via SUBCUTANEOUS
  Administered 2021-03-14: 3 [IU] via SUBCUTANEOUS
  Administered 2021-03-14 – 2021-03-15 (×3): 2 [IU] via SUBCUTANEOUS
  Administered 2021-03-15: 1 [IU] via SUBCUTANEOUS
  Administered 2021-03-15 (×2): 2 [IU] via SUBCUTANEOUS
  Administered 2021-03-15 – 2021-03-16 (×2): 1 [IU] via SUBCUTANEOUS
  Administered 2021-03-16 (×2): 2 [IU] via SUBCUTANEOUS

## 2021-03-13 MED ORDER — ONDANSETRON HCL 4 MG/2ML IJ SOLN
4.0000 mg | Freq: Once | INTRAMUSCULAR | Status: AC
  Administered 2021-03-13: 4 mg via INTRAVENOUS
  Filled 2021-03-13: qty 2

## 2021-03-13 MED ORDER — ACETAMINOPHEN 325 MG PO TABS: 650.0000 mg | ORAL_TABLET | Freq: Four times a day (QID) | ORAL | Status: DC | PRN

## 2021-03-13 MED ORDER — DICYCLOMINE HCL 10 MG PO CAPS
10.0000 mg | ORAL_CAPSULE | Freq: Four times a day (QID) | ORAL | Status: DC | PRN
  Administered 2021-03-15: 10 mg via ORAL
  Filled 2021-03-13 (×2): qty 1

## 2021-03-13 MED ORDER — ONDANSETRON HCL 4 MG/2ML IJ SOLN
4.0000 mg | Freq: Four times a day (QID) | INTRAMUSCULAR | Status: DC | PRN
  Administered 2021-03-13: 4 mg via INTRAVENOUS
  Filled 2021-03-13: qty 2

## 2021-03-13 MED ORDER — ONDANSETRON HCL 4 MG/2ML IJ SOLN
4.0000 mg | Freq: Four times a day (QID) | INTRAMUSCULAR | Status: AC
  Administered 2021-03-13 – 2021-03-14 (×5): 4 mg via INTRAVENOUS
  Filled 2021-03-13 (×5): qty 2

## 2021-03-13 MED ORDER — LACTATED RINGERS IV SOLN: INTRAVENOUS | Status: AC

## 2021-03-13 MED ORDER — ATORVASTATIN CALCIUM 10 MG PO TABS
20.0000 mg | ORAL_TABLET | Freq: Every day | ORAL | Status: DC
  Administered 2021-03-14 – 2021-03-17 (×4): 20 mg via ORAL
  Filled 2021-03-13 (×5): qty 2

## 2021-03-13 MED ORDER — GABAPENTIN 100 MG PO CAPS
100.0000 mg | ORAL_CAPSULE | Freq: Two times a day (BID) | ORAL | Status: DC
  Administered 2021-03-13 – 2021-03-17 (×8): 100 mg via ORAL
  Filled 2021-03-13 (×9): qty 1

## 2021-03-13 MED ORDER — LEVOTHYROXINE SODIUM 88 MCG PO TABS
88.0000 ug | ORAL_TABLET | Freq: Every day | ORAL | Status: DC
  Administered 2021-03-14 – 2021-03-17 (×4): 88 ug via ORAL
  Filled 2021-03-13 (×7): qty 1

## 2021-03-13 MED ORDER — MELATONIN 3 MG PO TABS
3.0000 mg | ORAL_TABLET | Freq: Every evening | ORAL | Status: DC | PRN
  Administered 2021-03-14 – 2021-03-16 (×3): 3 mg via ORAL
  Filled 2021-03-13 (×3): qty 1

## 2021-03-13 MED ORDER — SUCRALFATE 1 GM/10ML PO SUSP
1.0000 g | Freq: Four times a day (QID) | ORAL | Status: DC
  Administered 2021-03-13 – 2021-03-17 (×16): 1 g via ORAL
  Filled 2021-03-13 (×16): qty 10

## 2021-03-13 NOTE — Progress Notes (Signed)
    Suzanne Pierce is a 65 y.o. female with medical history significant for GERD, gastritis, LA grade D esophagitis, iron deficiency anemia, hyperlipidemia, type 2 diabetes, diabetic polyneuropathy, essential hypertension, hypothyroidism, who presented to [facility] ED from home due to hematemesis and coffee-ground emesis. Pt admitted by Dr Nevada Crane this am please see note for detailed H&p      GI consulted and plan for EGD in am.  Continue with IV PPI BID, and IV zofran and clears as tolerated.  Monitor hemoglobin and transfuse to keep hemoglobin greater than 7. Hemoglobin stable around 12. Continue to monitor.  Her creatinine appears to be at baseline.    Hosie Poisson, MD

## 2021-03-13 NOTE — ED Notes (Signed)
Changed bed linens and iv site hub disconnected.  Site reconnected and dressing changed

## 2021-03-13 NOTE — Consult Note (Addendum)
[facility] Consult: 8:20 AM 03/13/2021  LOS: 0 days    Referring Provider: Dr Karleen Hampshire  Primary Care Physician:  Myrlene Broker, MD Primary Gastroenterologist:  Dr. Jackquline Denmark Husband: Iona Beard [phone]    Reason for Consultation:  Hematemesis   HPI: Suzanne Pierce is a 65 y.o. female.   Poorly controlled IDDM.  CKD.  Hypothyroidism Previous cholecystectomy, BTL, hysterectomy. Long hx of acute episodes of epigastric pain dating back many years.  EGDs 06/2010 and 10/2015.  Both normal studies.  Biopsies negative for H. pylori 06/2010 CTAP in 2015, 09/2019 negative Unclear if she has ever had gastric emptying study though it was mentioned as a possible study to be pursued in Dr. Steve Rattler office notes from [place]. 04/29/2019 colonoscopy.  Dr Lyndel Safe.  For chronic diarrhea.  The operative note mentions family history in her father of colon cancer at age 69 but this is her stepfather.  6 mm rectal polyp removed (TA).  Mild sigmoid diverticulosis.  Otherwise normal study to TI. Path from random colon biopsies: unremarkable/benign/normal. 12/14/2018 stool culture, stool WBC, stool Campylobacter, Shigella toxin negative.  Fecal lactoferrin positive. 10/2018 H. pylori breath test WNL. 06/2019 EGD.  For N/V, epigastric pain.  Extensive grade D esophagitis.  Gastritis.  Bulbar erythema.  No signs of active bleeding.  Pathology confirmed peptic duodenitis, reactive gastropathy/chronic inflammation, no H. pylori.  Reflux changes in esophagus without metaplasia etc. Dr. Lyndel Safe suspects IBS/D with element of postcholecystectomy diarrhea.  Has treated her with cholestyramine. 12/2020 EGD in [place].  "Everything was fine" per pt.  Placed on bid PPI.    Currently on Protonix 40 mg /daily.  No longer taking carafate.  Last  time she took iron was a couple of months ago.  Does not take NSAIDs or aspirin products. GI wise was doing well for the last few months, ports no home episodes of pain, vomiting, diarrhea until the last few days.  Starting this Monday, 2 days ago, developed watery, brown diarrhea then epigastric pain.  Diarrhea resolved.  However she developed nausea and on Tuesday began vomiting.  She had 2 episodes in total and these appeared cola/coffee colored.  The epigastric discomfort and nausea continue but she has not vomited since later yesterday.  She also described nonexertional chest pain which she cannot characterize just that it hurts.  Denies hiccups.  Denies sick contacts, lives with her husband and daughter.  Other than mild tachycardia to low 100s, vital signs unremarkable.   Hgb 12.1  >> 12.  MCV 83.   AKI, GFR 32.  LFTs wnl.  Glucose max 193. A1c 7.3 (mean glucose 163). Coags wnl.   Acute abdomen with chest films negative.  Small amount of stool in colon.  Cholecystectomy clips.  Denies alcohol, NSAIDs, illicit drugs.  Fm Hx: Breast cancer in a sister, heart disease in her mother.  Colon cancer, htn in father.  Diabetes in both parents.      Past Medical History:  Diagnosis Date   Diabetes [facility])    Family history of colon cancer  GERD (gastroesophageal reflux disease)    History of colon polyps    Hyperlipidemia    Hypertension    Hypothyroidism    Iron deficiency anemia    Irritable bowel syndrome (IBS)    with diarrhea   Kidney stones    Trigger index finger of right hand     Past Surgical History:  Procedure Laterality Date   BIOPSY  06/17/2019   Procedure: BIOPSY;  Surgeon: Thornton Park, MD;  Location: [place];  Service: Gastroenterology;;   COLONOSCOPY  12/19/2015   Mild sigmoid diverticulosis. Small internal hemorrhoids   ESOPHAGOGASTRODUODENOSCOPY  10/31/2015   Normal EGD   ESOPHAGOGASTRODUODENOSCOPY (EGD) WITH PROPOFOL N/A 06/17/2019   Procedure:  ESOPHAGOGASTRODUODENOSCOPY (EGD) WITH PROPOFOL;  Surgeon: Thornton Park, MD;  Location: [place];  Service: Gastroenterology;  Laterality: N/A;   LITHOTRIPSY  01/2014   PARTIAL HYSTERECTOMY  2002    Prior to Admission medications   Medication Sig Start Date End Date Taking? Authorizing Provider  acetaminophen (TYLENOL) 500 MG tablet Take 500-1,000 mg by mouth every 6 (six) hours as needed for mild pain or headache.   Yes [provider]  amLODipine (NORVASC) 10 MG tablet Take 10 mg by mouth daily.   Yes [provider]  atorvastatin (LIPITOR) 20 MG tablet Take 20 mg by mouth daily. 10/07/19  Yes [provider]  BD PEN NEEDLE NANO 2ND GEN 32G X 4 MM MISC as directed. use as directed 03/15/19  Yes [provider]  Blood Glucose Monitoring Suppl (FIFTY50 GLUCOSE METER 2.0) w/Device KIT as directed. 11/22/18  Yes [provider]  brimonidine (ALPHAGAN) 0.15 % ophthalmic solution Place 1 drop into both eyes 2 (two) times daily. 03/08/21  Yes [provider]  dorzolamide-timolol (COSOPT) 22.3-6.8 MG/ML ophthalmic solution Place 1 drop into both eyes in the morning and at bedtime. 01/08/21  Yes [provider]  ferrous sulfate 325 (65 FE) MG tablet Take 325 mg by mouth daily with breakfast.   Yes [provider]  FIASP FLEXTOUCH 100 UNIT/ML FlexTouch Pen Inject 4-5 Units into the skin 3 (three) times daily with meals. 4 units in AM & evening. 5 units HS 12/17/20  Yes [provider]  gabapentin (NEURONTIN) 300 MG capsule Take 300-600 mg by mouth See admin instructions. Take 300 mg by mouth every 6 hours, and 600 mg at bedtime 03/10/19  Yes [provider]  latanoprost (XALATAN) 0.005 % ophthalmic solution Place 1 drop into both eyes 2 (two) times daily.   Yes [provider]  levothyroxine (SYNTHROID, LEVOTHROID) 88 MCG tablet Take 88 mcg by mouth daily.   Yes [provider]  losartan (COZAAR) 50  MG tablet Take 50 mg by mouth daily. 11/06/20  Yes [provider]  meloxicam (MOBIC) 7.5 MG tablet Take 7.5 mg by mouth as needed for pain.   Yes [provider]  pantoprazole (PROTONIX) 40 MG tablet Take 1 tablet (40 mg total) by mouth 2 (two) times daily. Patient taking differently: Take 40 mg by mouth daily. 06/19/19  Yes Cristal Deer, MD  tizanidine (ZANAFLEX) 6 MG capsule Take 6 mg by mouth at bedtime as needed for muscle spasms. 03/12/21  Yes [provider]  TRESIBA FLEXTOUCH 100 UNIT/ML FlexTouch Pen Inject 45 Units into the skin daily. 12/17/20  Yes [provider]  ondansetron (ZOFRAN) 4 MG tablet Take 1 tablet (4 mg total) by mouth every 6 (six) hours as needed for nausea. Patient not taking: No sig reported 06/19/19  Cristal Deer, MD  sucralfate (CARAFATE) 1 GM/10ML suspension Take 10 mLs (1 g total) by mouth 4 (four) times daily -  with meals and at bedtime. Patient not taking: Reported on 03/13/2021 06/19/19   Cristal Deer, MD    Scheduled Meds:  atorvastatin  20 mg Oral Daily   gabapentin  100 mg Oral BID   insulin aspart  0-9 Units Subcutaneous Q4H   levothyroxine  88 mcg Oral Daily   [START ON 03/16/2021] pantoprazole  40 mg Intravenous Q12H   Infusions:  lactated ringers     pantoprazole 8 mg/hr (03/13/21 0152)   PRN Meds: acetaminophen, dicyclomine, melatonin, ondansetron (ZOFRAN) IV   Allergies as of 03/12/2021 - Review Complete 03/12/2021  Allergen Reaction Noted   Penicillins Hives 12/09/2011    Family History  Problem Relation Age of Onset   Colon cancer Father    Rectal cancer Neg Hx     Social History   Socioeconomic History   Marital status: Married    Spouse name: Not on file   Number of children: Not on file   Years of education: Not on file   Highest education level: Not on file  Occupational History   Not on file  Tobacco Use   Smoking status: Never   Smokeless tobacco: Never  Vaping Use   Vaping  Use: Never used  Substance and Sexual Activity   Alcohol use: No   Drug use: No   Sexual activity: Not on file  Other Topics Concern   Not on file  Social History Narrative   Not on file   Social Determinants of Health   Financial Resource Strain: Not on file  Food Insecurity: Not on file  Transportation Needs: Not on file  Physical Activity: Not on file  Stress: Not on file  Social Connections: Not on file  Intimate Partner Violence: Not on file    REVIEW OF SYSTEMS: Constitutional: Feels weak.  Generally has a good amount of energy but is fairly and active, does not exercise. ENT:  No nose bleeds Pulm: No shortness of breath, no cough. CV:  No palpitations, no LE edema.  GU:  No hematuria, no frequency GI: See HPI. Heme: No unusual or excessive bleeding or bruising. Transfusions: No records of prior blood transfusions. Neuro: Numbness/tingling in her feet chronic issue Derm:  No itching, no rash or sores.  Endocrine: Home sugars have been running in the 90s to 120s but she has had a couple of episodes where they dropped to the 50s over the past few months.  No sweats or chills.  No polyuria or dysuria Immunization: Vaccinated and boosted x1 for COVID-19. Travel:  None beyond local counties in last few months.    PHYSICAL EXAM: Vital signs in last 24 hours: Vitals:   03/13/21 0600 03/13/21 0645  BP: 134/76 133/74  Pulse: (!) 103 (!) 102  Resp: 12 15  Temp:    SpO2: 97% 97%   Wt Readings from Last 3 Encounters:  10/25/19 71.7 kg  07/27/19 69.4 kg  06/16/19 65.9 kg    General: Comfortable, does not look acutely ill.  Appears a bit tired. Head: No facial asymmetry or swelling.  No signs of head trauma Eyes: Conjunctiva pink.  No scleral icterus.  EOMI Ears: Not hard of hearing Nose: No congestion or discharge Mouth: Poor dentition, many absent teeth, caries present.  Mucosa moist, pink, clear.  Tongue midline. Neck: No JVD, no masses, no thyromegaly Lungs:  Clear bilaterally without labored  breathing or cough. Heart: RRR.  No MRG.  S1, S2 present Abdomen: Soft.  Not distended.  Active bowel sounds.  Minor discomfort in the epigastric region without guarding or rebound.  No HSM, masses, bruits, hernia.   Rectal: Deferred Musc/Skeltl: No obvious joint deformities, no joint swelling.  No joint erythema. Extremities: No CCE. Neurologic: Alert.  Appropriate.  Oriented x3.  Moves all 4 limbs without tremor or gross weakness, formal strength testing not performed. Skin: No rash, no sores, no telangiectasia, no significant purpura or bruising Tattoos: None observed Nodes: No cervical adenopathy Psych: Cooperative.  Affect flat.  Calm.  Fluid speech.  Intake/Output from previous day: No intake/output data recorded. Intake/Output this shift: No intake/output data recorded.  LAB RESULTS: Recent Labs    03/12/21 2017 03/13/21 0334  WBC 8.8 10.0  HGB 12.1 12.0  HCT 39.3 39.3  PLT 324 307   BMET Lab Results  Component Value Date   NA 144 03/13/2021   NA 144 03/12/2021   NA 142 10/01/2019   K 4.3 03/13/2021   K 3.7 03/12/2021   K 4.3 10/01/2019   CL 112 (H) 03/13/2021   CL 113 (H) 03/12/2021   CL 110 10/01/2019   CO2 19 (L) 03/13/2021   CO2 19 (L) 03/12/2021   CO2 20 (L) 10/01/2019   GLUCOSE 191 (H) 03/13/2021   GLUCOSE 143 (H) 03/12/2021   GLUCOSE 334 (H) 10/01/2019   BUN 33 (H) 03/13/2021   BUN 26 (H) 03/12/2021   BUN 22 10/01/2019   CREATININE 1.77 (H) 03/13/2021   CREATININE 1.58 (H) 03/12/2021   CREATININE 1.64 (H) 10/01/2019   CALCIUM 10.6 (H) 03/13/2021   CALCIUM 10.3 03/12/2021   CALCIUM 10.8 (H) 10/01/2019   LFT Recent Labs    03/12/21 2017  PROT 8.0  ALBUMIN 4.0  AST 22  ALT 20  ALKPHOS 123  BILITOT 0.8   PT/INR Lab Results  Component Value Date   INR 1.0 03/13/2021   Hepatitis Panel No results for input(s): HEPBSAG, HCVAB, HEPAIGM, HEPBIGM in the last 72 hours. C-Diff No components found for:  CDIFF Lipase     Component Value Date/Time   LIPASE 28 03/12/2021 2017    Drugs of Abuse  No results found for: LABOPIA, COCAINSCRNUR, LABBENZ, AMPHETMU, THCU, LABBARB   RADIOLOGY STUDIES: DG Abdomen Acute W/Chest  Result Date: 03/13/2021 CLINICAL DATA:  Epigastric pain for 3 days.  Cough a ground emesis. EXAM: DG ABDOMEN ACUTE WITH 1 VIEW CHEST COMPARISON:  Most recent CT 12/25/2020 FINDINGS: The cardiomediastinal contours are normal. No evidence of pneumomediastinum. The lungs are clear. No pleural fluid. There is no free intra-abdominal air. No dilated bowel loops to suggest obstruction. Small volume of stool throughout the colon. Cholecystectomy clips in the right upper quadrant. No radiopaque calculi. There are vascular calcifications and pelvic phleboliths. No acute osseous abnormalities are seen. IMPRESSION: Negative abdominal radiographs. No acute cardiopulmonary disease. Electronically Signed   By: Keith Rake M.D.   On: 03/13/2021 00:50      IMPRESSION:      Recurrence of upper GI symptoms: Epigastric pain, nausea, vomiting with coffee like emesis.  This is occurred before and on EGD has had severe esophagitis.  Taking Protonix 40 mg daily, no NSAIDs or aspirin products.  Question element of diabetic gastroparesis.    PLAN:        EGD?  If pursued would be set up for tomorrow.     Protonix 40 mg IV bid as doing.  Scheduled zofran IV q 6h for 24 h. Add Carafate 1 g qid.      Clears as tolerated.     Azucena Freed  03/13/2021, 8:20 AM Phone [phone]

## 2021-03-13 NOTE — Progress Notes (Signed)
Patient transferred from ED to [phone]. Patient is alert and oriented to person, place, time, and situation. Telemetry monitoring enabled, vital signs taken, and IV assessed for patency. Skin checked with [facility] RN. Fall precautions initiated. Patient bed in the locked, lowest position. Non-slip socks in place and bed alarm on. Call bell is within reach. Patient knows to call for assistance prior to getting up and patient demonstrates use. Patient is laying comfortably in bed.

## 2021-03-13 NOTE — H&P (Signed)
History and Physical  [name]:[id number] DOB: Apr 07, 1956 DOA: 03/12/2021  Referring physician: Dr. Randal Buba, [place]. PCP: Myrlene Broker, MD  Outpatient Specialists: GI. Patient coming from: Home.  Chief Complaint: Vomiting blood.  HPI: Suzanne Pierce is a 65 y.o. female with medical history significant for GERD, gastritis, LA grade D esophagitis, iron deficiency anemia, hyperlipidemia, type 2 diabetes, diabetic polyneuropathy, essential hypertension, hypothyroidism, who presented to [facility] ED from home due to hematemesis and coffee-ground emesis on the day of presentation which persisted in the ED.  Associated with sudden onset epigastric pain x3 days.  Denies use of alcohol, NSAIDs or marijuana.  Associated with frequent watery stools 3 days ago.  Positive night sweats.  No fevers.  Endorses she had a similar event in the past.  Her last EGD was in May 2022 in [place].  She states she was told everything was fine.  Per our medical record she also had EGD done in 2020 which showed gastritis and LA grade D esophagitis.  Patient was advised to be on the PPI twice daily x8 weeks and to return for follow-up.  She presented to the ED for further evaluation and management of her symptomatology.  While in the ED she received IV Protonix bolus and was started on IV Protonix drip and IV fluid hydration.  She was kept n.p.o.  GI contacted by EDP for possible EGD in the morning.  Patient admitted under hospitalist service.   ED Course: Temperature 99 F.  BP 125/80.  Pulse 106, respiration rate 17.  O2 saturation 100% on room air.  Lab studies remarkable for serum bicarb 19, glucose 143, BUN at 26, creatinine 1.58, GFR 27, anion gap 12.  CBC essentially unremarkable.  Review of Systems: Review of systems as noted in the HPI. All other systems reviewed and are negative.   Past Medical History:  Diagnosis Date   Diabetes ([place])    Family history of colon cancer    GERD (gastroesophageal  reflux disease)    History of colon polyps    Hyperlipidemia    Hypertension    Hypothyroidism    Iron deficiency anemia    Irritable bowel syndrome (IBS)    with diarrhea   Kidney stones    Trigger index finger of right hand    Past Surgical History:  Procedure Laterality Date   BIOPSY  06/17/2019   Procedure: BIOPSY;  Surgeon: Thornton Park, MD;  Location: [place];  Service: Gastroenterology;;   COLONOSCOPY  12/19/2015   Mild sigmoid diverticulosis. Small internal hemorrhoids   ESOPHAGOGASTRODUODENOSCOPY  10/31/2015   Normal EGD   ESOPHAGOGASTRODUODENOSCOPY (EGD) WITH PROPOFOL N/A 06/17/2019   Procedure: ESOPHAGOGASTRODUODENOSCOPY (EGD) WITH PROPOFOL;  Surgeon: Thornton Park, MD;  Location: [place];  Service: Gastroenterology;  Laterality: N/A;   LITHOTRIPSY  01/2014   PARTIAL HYSTERECTOMY  2002    Social History:  reports that she has never smoked. She has never used smokeless tobacco. She reports that she does not drink alcohol and does not use drugs.   Allergies  Allergen Reactions   Penicillins Hives    Did it involve swelling of the face/tongue/throat, SOB, or low BP? No, hives on the arms Did it involve sudden or severe rash/hives, skin peeling, or any reaction on the inside of your mouth or nose? No Did you need to seek medical attention at a hospital or doctor's office? Yes When did it last happen? "I was 12" If all above answers are "NO", may proceed with cephalosporin  use.     Family History  Problem Relation Age of Onset   Colon cancer Father    Rectal cancer Neg Hx       Prior to Admission medications   Medication Sig Start Date End Date Taking? Authorizing Provider  acetaminophen (TYLENOL) 500 MG tablet Take 500-1,000 mg by mouth every 6 (six) hours as needed for mild pain or headache.    [provider]  amLODipine (NORVASC) 10 MG tablet Take 10 mg by mouth daily.    [provider]  atorvastatin (LIPITOR) 20 MG  tablet 1 tablet daily. 10/07/19   [provider]  BD PEN NEEDLE NANO 2ND GEN 32G X 4 MM MISC as directed. use as directed 03/15/19   [provider]  Blood Glucose Monitoring Suppl (FIFTY50 GLUCOSE METER 2.0) w/Device KIT as directed. 11/22/18   [provider]  brimonidine (ALPHAGAN) 0.15 % ophthalmic solution Place 1 drop into both eyes 2 (two) times daily. 03/08/21   [provider]  dorzolamide-timolol (COSOPT) 22.3-6.8 MG/ML ophthalmic solution Place 1 drop into both eyes in the morning and at bedtime. 01/08/21   [provider]  ferrous sulfate 325 (65 FE) MG tablet Take 325 mg by mouth daily with breakfast.    [provider]  gabapentin (NEURONTIN) 300 MG capsule Take 300-600 mg by mouth See admin instructions. Take 300 mg by mouth every 6 hours, and 600 mg at bedtime 03/10/19   [provider]  Insulin Detemir (LEVEMIR FLEXTOUCH) 100 UNIT/ML Pen Inject 22-30 Units into the skin See admin instructions. Inject 30 units into the skin in the morning before breakfast and 22 units at bedtime    [provider]  latanoprost (XALATAN) 0.005 % ophthalmic solution Place 1 drop into both eyes 2 (two) times daily.    [provider]  levothyroxine (SYNTHROID, LEVOTHROID) 88 MCG tablet Take 88 mcg by mouth daily.    [provider]  meloxicam (MOBIC) 7.5 MG tablet Take 7.5 mg by mouth as needed for pain.    [provider]  ondansetron (ZOFRAN) 4 MG tablet Take 1 tablet (4 mg total) by mouth every 6 (six) hours as needed for nausea. Patient not taking: Reported on 10/25/2019 06/19/19   Cristal Deer, MD  pantoprazole (PROTONIX) 40 MG tablet Take 1 tablet (40 mg total) by mouth 2 (two) times daily. Patient taking differently: Take 40 mg by mouth daily.  06/19/19   Cristal Deer, MD  sucralfate (CARAFATE) 1 GM/10ML suspension Take 10 mLs (1 g total) by mouth 4 (four) times daily -  with meals and at bedtime.  06/19/19   Cristal Deer, MD  timolol (TIMOPTIC) 0.5 % ophthalmic solution Place 1 drop into both eyes 2 (two) times daily.  03/05/19   [provider]    Physical Exam: BP 125/80 (BP Location: Right Arm)   Pulse (!) 106   Temp 99 F (37.2 C) (Oral)   Resp 17   SpO2 100%   General: 65 y.o. year-old female well developed well nourished in no acute distress.  Alert and oriented x3. Cardiovascular: Regular rate and rhythm with no rubs or gallops.  No thyromegaly or JVD noted.  No lower extremity edema. 2/4 pulses in all 4 extremities. Respiratory: Clear to auscultation with no wheezes or rales. Good inspiratory effort. Abdomen: Soft diffuse abdominal tenderness with palpation.  Bowel sounds present.   Muskuloskeletal: No cyanosis, clubbing or edema noted bilaterally Neuro: CN II-XII intact, strength, sensation, reflexes Skin: No  ulcerative lesions noted or rashes Psychiatry: Judgement and insight appear normal. Mood is appropriate for condition and setting          Labs on Admission:  Basic Metabolic Panel: Recent Labs  Lab 03/12/21 2017  NA 144  K 3.7  CL 113*  CO2 19*  GLUCOSE 143*  BUN 26*  CREATININE 1.58*  CALCIUM 10.3   Liver Function Tests: Recent Labs  Lab 03/12/21 2017  AST 22  ALT 20  ALKPHOS 123  BILITOT 0.8  PROT 8.0  ALBUMIN 4.0   Recent Labs  Lab 03/12/21 2017  LIPASE 28   No results for input(s): AMMONIA in the last 168 hours. CBC: Recent Labs  Lab 03/12/21 2017  WBC 8.8  NEUTROABS 6.5  HGB 12.1  HCT 39.3  MCV 83.3  PLT 324   Cardiac Enzymes: No results for input(s): CKTOTAL, CKMB, CKMBINDEX, TROPONINI in the last 168 hours.  BNP (last 3 results) No results for input(s): BNP in the last 8760 hours.  ProBNP (last 3 results) No results for input(s): PROBNP in the last 8760 hours.  CBG: No results for input(s): GLUCAP in the last 168 hours.  Radiological Exams on Admission: DG Abdomen Acute W/Chest  Result Date:  03/13/2021 CLINICAL DATA:  Epigastric pain for 3 days.  Cough a ground emesis. EXAM: DG ABDOMEN ACUTE WITH 1 VIEW CHEST COMPARISON:  Most recent CT 12/25/2020 FINDINGS: The cardiomediastinal contours are normal. No evidence of pneumomediastinum. The lungs are clear. No pleural fluid. There is no free intra-abdominal air. No dilated bowel loops to suggest obstruction. Small volume of stool throughout the colon. Cholecystectomy clips in the right upper quadrant. No radiopaque calculi. There are vascular calcifications and pelvic phleboliths. No acute osseous abnormalities are seen. IMPRESSION: Negative abdominal radiographs. No acute cardiopulmonary disease. Electronically Signed   By: Keith Rake M.D.   On: 03/13/2021 00:50    EKG: I independently viewed the EKG done and my findings are as followed: None available at the time of this visit.  Assessment/Plan Present on Admission:  GI bleed  Active Problems:   GI bleed  GI bleed, suspected upper GI Presented with complaints of hematemesis, coffee-ground emesis with onset on the day of presentation, associated with abdominal pain x3 days. Patient has a history of GERD, gastritis and esophagitis. Started on Protonix drip in the ED, continue. GI has been consulted by EDP. Keep n.p.o. for possible EGD in the morning. Obtain INR level, transfuse for hemoglobin less than 7.0. Continue to monitor H&H Keep MAP greater than 65, continue IV fluid.  Intractable nausea and vomiting, rule out gastritis GI consulted by EDP IV antiemetics as needed N.p.o. until seen by GI  Type 2 diabetes with hyperglycemia Presented with hyperglycemia Hemoglobin A1c 11.5 on 06/15/2019. Repeat hemoglobin A1c Start insulin sliding scale every 4 hours while NPO.  Non-anion gap metabolic acidosis Presented with serum bicarb of 19 Continue IV fluid hydration Repeat BMP in the morning.  CKD 3B She appears to be close to her baseline creatinine 1.5 with GFR of  37. Avoid nephrotoxic agents/dehydration/hypotension Monitor urine output Repeat BMP in the morning.  Essential hypertension Hold off home oral antihypertensives due to GI bleed to avoid hypotension Maintain MAP greater than 65 Continue gentle IV fluid hydration.  Hypothyroidism Resume home levothyroxine  Diabetic polyneuropathy/hyperlipidemia Resume home regimen.   DVT prophylaxis: SCDs.  Pharmacological DVT prophylaxis is contraindicated in the setting of GI bleed  Code Status: Full code  Family Communication: None at bedside  Disposition Plan: Admit to progressive unit  Consults called: GI consulted by EDP.  Admission status: Inpatient status.  Patient will require at least 2 midnights for further evaluation and treatment of present condition.   Status is: Inpatient    Dispo:  Patient From: Home  Planned Disposition: Home, possibly on 03/15/21 or when GI signs off.  Medically stable for discharge: No         Kayleen Memos MD [facility] Pager [phone]  If 7PM-7AM, please contact night-coverage [email or web address] Password [email or web address]  03/13/2021, 1:52 AM

## 2021-03-13 NOTE — ED Notes (Signed)
Husband Iona Beard [phone] would like an update asap

## 2021-03-14 DIAGNOSIS — R112 Nausea with vomiting, unspecified: Secondary | ICD-10-CM | POA: Diagnosis not present

## 2021-03-14 DIAGNOSIS — R1013 Epigastric pain: Secondary | ICD-10-CM

## 2021-03-14 LAB — GLUCOSE, CAPILLARY
Glucose-Capillary: 102 mg/dL — ABNORMAL HIGH (ref 70–99)
Glucose-Capillary: 121 mg/dL — ABNORMAL HIGH (ref 70–99)
Glucose-Capillary: 118 mg/dL — ABNORMAL HIGH (ref 70–99)
Glucose-Capillary: 184 mg/dL — ABNORMAL HIGH (ref 70–99)
Glucose-Capillary: 202 mg/dL — ABNORMAL HIGH (ref 70–99)

## 2021-03-14 LAB — BASIC METABOLIC PANEL
Anion gap: 10 (ref 5–15)
BUN: 33 mg/dL — ABNORMAL HIGH (ref 8–23)
CO2: 21 mmol/L — ABNORMAL LOW (ref 22–32)
Calcium: 10.2 mg/dL (ref 8.9–10.3)
Chloride: 114 mmol/L — ABNORMAL HIGH (ref 98–111)
Creatinine, Ser: 1.72 mg/dL — ABNORMAL HIGH (ref 0.44–1.00)
GFR, Estimated: 33 mL/min — ABNORMAL LOW (ref >60)
Glucose, Bld: 102 mg/dL — ABNORMAL HIGH (ref 70–99)
Potassium: 3.7 mmol/L (ref 3.5–5.1)
Sodium: 145 mmol/L (ref 135–145)

## 2021-03-14 LAB — CBC
HCT: 35.7 % — ABNORMAL LOW (ref 36.0–46.0)
Hemoglobin: 11.1 g/dL — ABNORMAL LOW (ref 12.0–15.0)
MCH: 25.5 pg — ABNORMAL LOW (ref 26.0–34.0)
MCHC: 31.1 g/dL (ref 30.0–36.0)
MCV: 82.1 fL (ref 80.0–100.0)
Platelets: 307 10*3/uL (ref 150–400)
RBC: 4.35 MIL/uL (ref 3.87–5.11)
RDW: 13.9 % (ref 11.5–15.5)
WBC: 9.9 10*3/uL (ref 4.0–10.5)
nRBC: 0 % (ref 0.0–0.2)

## 2021-03-14 LAB — MRSA NEXT GEN BY PCR, NASAL: MRSA by PCR Next Gen: NOT DETECTED

## 2021-03-14 MED ORDER — METOCLOPRAMIDE HCL 5 MG/ML IJ SOLN
10.0000 mg | Freq: Four times a day (QID) | INTRAMUSCULAR | Status: AC
  Administered 2021-03-14 – 2021-03-15 (×4): 10 mg via INTRAVENOUS
  Filled 2021-03-14 (×4): qty 2

## 2021-03-14 MED ORDER — LACTATED RINGERS IV SOLN: INTRAVENOUS | Status: DC

## 2021-03-14 NOTE — Progress Notes (Signed)
PROGRESS NOTE    Suzanne Pierce  [name] DOB: [birth year] DOA: 03/12/2021 PCP: Myrlene Broker, MD    Chief Complaint  Patient presents with   Hematemesis    Brief Narrative: Suzanne Pierce is a 65 y.o. female with medical history significant for GERD, gastritis, LA grade D esophagitis, iron deficiency anemia, hyperlipidemia, type 2 diabetes, diabetic polyneuropathy, essential hypertension, hypothyroidism, who presented to [facility] ED from home due to hematemesis and coffee-ground emesis.   Assessment & Plan:   Active Problems:   Diabetes mellitus (HCC)   Nausea and/or vomiting   Dehydration   ARF (acute renal failure) (HCC)   Type 2 diabetes mellitus with hyperlipidemia (HCC)   GI bleed  Nausea, vomiting and abdominal pain:  - probably second ary to gastroparesis.  Currently on IV reglan, IV ppi , IV fluids.  Pain control.  Currently on clears. Continue the same.  GI on board, resume the same.     Dehydration:  - continue with IV lfuids.    ARF on stage 3a CKD:  Baseline creatinine of 1.5.  Probably from dehydration. Continue IV fluids.    Type 2 DM:  CBG (last 3)  Recent Labs    03/14/21 0345 03/14/21 0810 03/14/21 1207  GLUCAP 102* 121* 118*   Continue with SSI.  Hemoglobin A1c is 7.3   Hypertension:  Suboptimally controlled.  Resume home meds   Mild anemia of chronic disease:  Hemoglobin around 11.  Normocytic.    Hyperlipidemia:  Resume statin.    Hypothyroidism:  Resume synthroid.     DVT prophylaxis: (Lovenox) Code Status: (Full Code) Family Communication: none at bedside. Disposition:   Status is: Inpatient  Remains inpatient appropriate because:IV treatments appropriate due to intensity of illness or inability to take PO  Dispo:  Patient From: Home  Planned Disposition: Home  Medically stable for discharge: No         Consultants:  GI  Procedures: NONE  Antimicrobials:  NONE  Subjective: Nauseated, no vomiting , mild to moderate abd pain.   Objective: Vitals:   03/13/21 2355 03/14/21 0347 03/14/21 0808 03/14/21 1205  BP: (!) 144/85 140/90 (!) 160/95 (!) 159/95  Pulse: (!) 108 95 100 (!) 103  Resp: '18 15 16 14  '$ Temp:  98.9 F (37.2 C) 98.8 F (37.1 C) 98.5 F (36.9 C)  TempSrc: Oral Axillary Oral Oral  SpO2: 99% 96% 99% 93%  Weight:      Height:        Intake/Output Summary (Last 24 hours) at 03/14/2021 1337 Last data filed at 03/14/2021 1127 Gross per 24 hour  Intake 1223.15 ml  Output 750 ml  Net 473.15 ml   Filed Weights   03/13/21 2030  Weight: 68.7 kg    Examination:  General exam: Appears calm and comfortable  Respiratory system: Clear to auscultation. Respiratory effort normal. Cardiovascular system: S1 & S2 heard, RRR. No JVD,  No pedal edema. Gastrointestinal system: Abdomen is nondistended, soft, mildly tender generalized. Bowel sounds wnl.  Central nervous system: Alert and oriented. No focal neurological deficits. Extremities: Symmetric 5 x 5 power. Skin: No rashes, lesions or ulcers Psychiatry: Mood & affect appropriate.     Data Reviewed: I have personally reviewed following labs and imaging studies  CBC: Recent Labs  Lab 03/12/21 2017 03/13/21 0334 03/14/21 0218  WBC 8.8 10.0 9.9  NEUTROABS 6.5  --   --   HGB 12.1 12.0 11.1*  HCT 39.3 39.3 35.7*  MCV 83.3 83.4  82.1  PLT 324 307 [id number]    Basic Metabolic Panel: Recent Labs  Lab 03/12/21 2017 03/13/21 0334 03/14/21 0218  NA 144 144 145  K 3.7 4.3 3.7  CL 113* 112* 114*  CO2 19* 19* 21*  GLUCOSE 143* 191* 102*  BUN 26* 33* 33*  CREATININE 1.58* 1.77* 1.72*  CALCIUM 10.3 10.6* 10.2  MG  --  2.0  --   PHOS  --  4.6  --     GFR: Estimated Creatinine Clearance: 31.1 mL/min (A) (by C-G formula based on SCr of 1.72 mg/dL (H)).  Liver Function Tests: Recent Labs  Lab 03/12/21 2017  AST 22  ALT 20  ALKPHOS 123  BILITOT 0.8  PROT 8.0  ALBUMIN 4.0     CBG: Recent Labs  Lab 03/13/21 2046 03/13/21 2353 03/14/21 0345 03/14/21 0810 03/14/21 1207  GLUCAP 132* 126* 102* 121* 118*     Recent Results (from the past 240 hour(s))  Resp Panel by RT-PCR (Flu A&B, Covid) Nasopharyngeal Swab     Status: None   Collection Time: 03/12/21 11:43 PM   Specimen: Nasopharyngeal Swab; Nasopharyngeal(NP) swabs in vial transport medium  Result Value Ref Range Status   SARS Coronavirus 2 by RT PCR NEGATIVE NEGATIVE Final    Comment: (NOTE) SARS-CoV-2 target nucleic acids are NOT DETECTED.  The SARS-CoV-2 RNA is generally detectable in upper respiratory specimens during the acute phase of infection. The lowest concentration of SARS-CoV-2 viral copies this assay can detect is 138 copies/mL. A negative result does not preclude SARS-Cov-2 infection and should not be used as the sole basis for treatment or other patient management decisions. A negative result may occur with  improper specimen collection/handling, submission of specimen other than nasopharyngeal swab, presence of viral mutation(s) within the areas targeted by this assay, and inadequate number of viral copies(<138 copies/mL). A negative result must be combined with clinical observations, patient history, and epidemiological information. The expected result is Negative.  Fact Sheet for Patients:  EntrepreneurPulse.com.au  Fact Sheet for Healthcare Providers:  IncredibleEmployment.be  This test is no t yet approved or cleared by the [place] FDA and  has been authorized for detection and/or diagnosis of SARS-CoV-2 by FDA under an Emergency Use Authorization (EUA). This EUA will remain  in effect (meaning this test can be used) for the duration of the COVID-19 declaration under Section 564(b)(1) of the Act, 21 U.S.C.section 360bbb-3(b)(1), unless the authorization is terminated  or revoked sooner.       Influenza A by PCR NEGATIVE  NEGATIVE Final   Influenza B by PCR NEGATIVE NEGATIVE Final    Comment: (NOTE) The Xpert Xpress SARS-CoV-2/FLU/RSV plus assay is intended as an aid in the diagnosis of influenza from Nasopharyngeal swab specimens and should not be used as a sole basis for treatment. Nasal washings and aspirates are unacceptable for Xpert Xpress SARS-CoV-2/FLU/RSV testing.  Fact Sheet for Patients: EntrepreneurPulse.com.au  Fact Sheet for Healthcare Providers: IncredibleEmployment.be  This test is not yet approved or cleared by the [place] FDA and has been authorized for detection and/or diagnosis of SARS-CoV-2 by FDA under an Emergency Use Authorization (EUA). This EUA will remain in effect (meaning this test can be used) for the duration of the COVID-19 declaration under Section 564(b)(1) of the Act, 21 U.S.C. section 360bbb-3(b)(1), unless the authorization is terminated or revoked.  Performed at [facility], [place] [address], [place], [place] [address]   MRSA Next Gen by PCR, Nasal  Status: None   Collection Time: 03/14/21  9:42 AM   Specimen: Nasal Mucosa; Nasal Swab  Result Value Ref Range Status   MRSA by PCR Next Gen NOT DETECTED NOT DETECTED Final    Comment: (NOTE) The GeneXpert MRSA Assay (FDA approved for NASAL specimens only), is one component of a comprehensive MRSA colonization surveillance program. It is not intended to diagnose MRSA infection nor to guide or monitor treatment for MRSA infections. Test performance is not FDA approved in patients less than 70 years old. Performed at [facility], [place] [address] [place],  [address]          Radiology Studies: DG Abdomen Acute W/Chest  Result Date: 03/13/2021 CLINICAL DATA:  Epigastric pain for 3 days.  Cough a ground emesis. EXAM: DG ABDOMEN ACUTE WITH 1 VIEW CHEST COMPARISON:  Most recent CT 12/25/2020 FINDINGS: The cardiomediastinal contours are normal. No  evidence of pneumomediastinum. The lungs are clear. No pleural fluid. There is no free intra-abdominal air. No dilated bowel loops to suggest obstruction. Small volume of stool throughout the colon. Cholecystectomy clips in the right upper quadrant. No radiopaque calculi. There are vascular calcifications and pelvic phleboliths. No acute osseous abnormalities are seen. IMPRESSION: Negative abdominal radiographs. No acute cardiopulmonary disease. Electronically Signed   By: Keith Rake M.D.   On: 03/13/2021 00:50        Scheduled Meds:  atorvastatin  20 mg Oral Daily   gabapentin  100 mg Oral BID   insulin aspart  0-9 Units Subcutaneous Q4H   levothyroxine  88 mcg Oral Daily   metoCLOPramide (REGLAN) injection  10 mg Intravenous Q6H   ondansetron (ZOFRAN) IV  4 mg Intravenous Q6H   [START ON 03/16/2021] pantoprazole  40 mg Intravenous Q12H   sucralfate  1 g Oral Q6H   Continuous Infusions:  lactated ringers     pantoprazole 8 mg/hr (03/14/21 0937)     LOS: 1 day        Hosie Poisson, MD [facility]   To contact the attending provider between 7A-7P or the covering provider during after hours 7P-7A, please log into the web site [email or web address] and access using universal [place] password for that web site. If you do not have the password, please call the hospital operator.  03/14/2021, 1:37 PM

## 2021-03-14 NOTE — Progress Notes (Signed)
Progress Note   Subjective  Patient still with epigastric abdominal pain, indigestion and nausea.  No vomiting today. Uncomfortable  Able to tolerate water   Objective  Vital signs in last 24 hours: Temp:  [97.9 F (36.6 C)-99.1 F (37.3 C)] 98.5 F (36.9 C) (08/04 1205) Pulse Rate:  [95-116] 103 (08/04 1205) Resp:  [13-20] 14 (08/04 1205) BP: (118-174)/(83-99) 159/95 (08/04 1205) SpO2:  [93 %-100 %] 93 % (08/04 1205) Weight:  [68.7 kg] 68.7 kg (08/03 2030) Last BM Date: 03/13/21  General: Alert, well-developed, uncomfortable appearing but in no acute distress Heart: Tachycardic and regular Chest: Clear to ascultation bilaterally Abdomen:  Soft, epigastric tenderness without rebound or guarding, nontender and nondistended. Normal bowel sounds.   Extremities:  Without edema. Neurologic:  Alert and  oriented x4; grossly normal neurologically. Psych:  Alert and cooperative. Normal mood and affect.  Intake/Output from previous day: 08/03 0701 - 08/04 0700 In: 1223.2 [I.V.:1223.2] Out: -  Intake/Output this shift: Total I/O In: -  Out: 750 [Urine:750]  Lab Results: Recent Labs    03/12/21 2017 03/13/21 0334 03/14/21 0218  WBC 8.8 10.0 9.9  HGB 12.1 12.0 11.1*  HCT 39.3 39.3 35.7*  PLT 324 307 307   BMET Recent Labs    03/12/21 2017 03/13/21 0334 03/14/21 0218  NA 144 144 145  K 3.7 4.3 3.7  CL 113* 112* 114*  CO2 19* 19* 21*  GLUCOSE 143* 191* 102*  BUN 26* 33* 33*  CREATININE 1.58* 1.77* 1.72*  CALCIUM 10.3 10.6* 10.2   LFT Recent Labs    03/12/21 2017  PROT 8.0  ALBUMIN 4.0  AST 22  ALT 20  ALKPHOS 123  BILITOT 0.8   PT/INR Recent Labs    03/13/21 0334  LABPROT 13.3  INR 1.0   Hepatitis Panel No results for input(s): HEPBSAG, HCVAB, HEPAIGM, HEPBIGM in the last 72 hours.  Studies/Results: DG Abdomen Acute W/Chest  Result Date: 03/13/2021 CLINICAL DATA:  Epigastric pain for 3 days.  Cough a ground emesis. EXAM: DG ABDOMEN ACUTE  WITH 1 VIEW CHEST COMPARISON:  Most recent CT 12/25/2020 FINDINGS: The cardiomediastinal contours are normal. No evidence of pneumomediastinum. The lungs are clear. No pleural fluid. There is no free intra-abdominal air. No dilated bowel loops to suggest obstruction. Small volume of stool throughout the colon. Cholecystectomy clips in the right upper quadrant. No radiopaque calculi. There are vascular calcifications and pelvic phleboliths. No acute osseous abnormalities are seen. IMPRESSION: Negative abdominal radiographs. No acute cardiopulmonary disease. Electronically Signed   By: Keith Rake M.D.   On: 03/13/2021 00:50      Assessment & Recommendations  65 year old female with history of poorly controlled diabetes, CKD, hypothyroidism, GERD with gastritis admitted with upper abdominal pain, coffee-ground emesis  1.  Nausea/vomiting/epigastric pain --likely gastroparesis flare with esophagitis and gastritis.  This has been found on previous endoscopic evaluation.  We have been treating medically, and reserving endoscopy should she not improve --She remains likely dehydrated, see #2 --Continue twice daily IV PPI --Continued scheduled ondansetron --I will add 4 doses of IV metoclopramide 10 mg every 6 hours --Clear liquid diet --Continue sucralfate suspension 1 g every 6 hours  2.  Volume depletion --she has a hyperchloremic mild acidosis likely related to dehydration.  Sodium is upper limit of normal.   --We will need further IV hydration per primary team until she is taking regular PO fluids      LOS: 1 day   Deitrick Ferreri M  Cariann Kinnamon  03/14/2021, 12:52 PM See AMION, [facility], to contact our on call provider

## 2021-03-15 DIAGNOSIS — E785 Hyperlipidemia, unspecified: Secondary | ICD-10-CM

## 2021-03-15 DIAGNOSIS — R112 Nausea with vomiting, unspecified: Secondary | ICD-10-CM | POA: Diagnosis not present

## 2021-03-15 DIAGNOSIS — N170 Acute kidney failure with tubular necrosis: Secondary | ICD-10-CM

## 2021-03-15 DIAGNOSIS — K3184 Gastroparesis: Secondary | ICD-10-CM

## 2021-03-15 DIAGNOSIS — E860 Dehydration: Secondary | ICD-10-CM

## 2021-03-15 DIAGNOSIS — E1143 Type 2 diabetes mellitus with diabetic autonomic (poly)neuropathy: Principal | ICD-10-CM

## 2021-03-15 DIAGNOSIS — E1169 Type 2 diabetes mellitus with other specified complication: Secondary | ICD-10-CM

## 2021-03-15 DIAGNOSIS — R1013 Epigastric pain: Secondary | ICD-10-CM | POA: Diagnosis not present

## 2021-03-15 LAB — GLUCOSE, CAPILLARY
Glucose-Capillary: 145 mg/dL — ABNORMAL HIGH (ref 70–99)
Glucose-Capillary: 148 mg/dL — ABNORMAL HIGH (ref 70–99)
Glucose-Capillary: 153 mg/dL — ABNORMAL HIGH (ref 70–99)
Glucose-Capillary: 160 mg/dL — ABNORMAL HIGH (ref 70–99)
Glucose-Capillary: 160 mg/dL — ABNORMAL HIGH (ref 70–99)
Glucose-Capillary: 164 mg/dL — ABNORMAL HIGH (ref 70–99)
Glucose-Capillary: 178 mg/dL — ABNORMAL HIGH (ref 70–99)

## 2021-03-15 MED ORDER — DORZOLAMIDE HCL-TIMOLOL MAL 2-0.5 % OP SOLN
1.0000 [drp] | Freq: Two times a day (BID) | OPHTHALMIC | Status: DC
  Administered 2021-03-15 – 2021-03-17 (×4): 1 [drp] via OPHTHALMIC
  Filled 2021-03-15: qty 10

## 2021-03-15 MED ORDER — BRIMONIDINE TARTRATE 0.15 % OP SOLN
1.0000 [drp] | Freq: Two times a day (BID) | OPHTHALMIC | Status: DC
  Administered 2021-03-15 – 2021-03-17 (×4): 1 [drp] via OPHTHALMIC
  Filled 2021-03-15: qty 5

## 2021-03-15 MED ORDER — METOCLOPRAMIDE HCL 5 MG/ML IJ SOLN
10.0000 mg | Freq: Four times a day (QID) | INTRAMUSCULAR | Status: AC
  Administered 2021-03-15 – 2021-03-16 (×4): 10 mg via INTRAVENOUS
  Filled 2021-03-15 (×4): qty 2

## 2021-03-15 MED ORDER — PANTOPRAZOLE SODIUM 40 MG IV SOLR
40.0000 mg | Freq: Two times a day (BID) | INTRAVENOUS | Status: DC
  Administered 2021-03-15 – 2021-03-17 (×5): 40 mg via INTRAVENOUS
  Filled 2021-03-15 (×5): qty 40

## 2021-03-15 MED ORDER — AMLODIPINE BESYLATE 10 MG PO TABS
10.0000 mg | ORAL_TABLET | Freq: Every day | ORAL | Status: DC
  Administered 2021-03-15 – 2021-03-17 (×3): 10 mg via ORAL
  Filled 2021-03-15 (×3): qty 1

## 2021-03-15 MED ORDER — PANTOPRAZOLE SODIUM 40 MG IV SOLR: 40.0000 mg | Freq: Two times a day (BID) | INTRAVENOUS | Status: DC

## 2021-03-15 NOTE — Progress Notes (Signed)
PROGRESS NOTE    Suzanne Pierce  [name] DOB: October 26, 1955 DOA: 03/12/2021 PCP: Myrlene Broker, MD    Brief Narrative:  Suzanne Pierce is a 65 year old female with past medical history significant for GERD, gastritis, LA grade D esophagitis, iron deficiency anemia, HLD, DM2, diabetic polyneuropathy, HTN, hypothyroidism who presented to [facility] ED on 8/2 with abdominal pain, nausea/vomiting, coffee like emesis.  Onset 3 days prior.  Denies alcohol, NSAID or marijuana abuse.  Last EGD May 2022 in [facility], told everything was fine.  Previous EGD in the EMR 2020 with showed gastritis and LA grade D esophagitis.  In the ED, temperature 9 9.0 F, BP 125/80, HR 106, RR 17, SPO2 100% on room air.  144, potassium 3.7, chloride 113, CO2 19, BUN 26, creatinine 1.58, glucose 143.  AST 22, ALT 20, total bilirubin 0.8.  WBC 8.8, hemoglobin 12.1, platelets 324.  COVID-19 PCR negative.  Influenza A/B PCR negative.  FOBT negative.  Abdominal series with no acute cardiopulmonary disease process and negative abdominal radiographs.  Patient was started on IV fluid hydration and IV Protonix drip.  Gastroenterology consulted by EDP.  TRH consulted for further evaluation and management of abdominal pain with reported coffee-ground emesis.   Assessment & Plan:   Active Problems:   Diabetes mellitus (HCC)   Nausea and/or vomiting   Dehydration   ARF (acute renal failure) (HCC)   Type 2 diabetes mellitus with hyperlipidemia (HCC)   GI bleed   Diabetic gastroparesis Hx LA grade D esophagitis Hx gastritis Patient presenting to ED with 3-day history of nausea/vomiting with associated epigastric pain.  Reported episode of coffee like emesis.  Denies EtOH or NSAID abuse.  FOBT negative.  Hemoglobin 12.1, stable on admission. --[facility] following, appreciate assistance --Hgb 12.1>12.0>11.1 today --CLD advance to FLD today --LR at 75 mL/h --Protonix 40 mg IV every 12  hours --Carafate 1 g p.o. every 6 hours --Reglan 10 mg IV every 6 hours --Bentyl 10 mg every 6 hours as needed abdominal spasms --Supportive care  Essential hypertension Home medications include amlodipine 10 mg p.o. daily, losartan 50 mg p.o. daily --BP elevated 169/92 this morning --Restart home amlodipine 10 mg p.o. daily --Hold home losartan --Monitor BP closely  Hypothyroidism --Levothyroxine 88 mcg p.o. daily  Type 2 diabetes mellitus On Tresiba 45 units subcutaneously daily at home.  Hemoglobin A1c 7.3, well controlled. --Sensitive SSI for coverage --CBGs qAC/HS  CKD stage III Baseline creatinine 1.6-2.0 --Cr 1.58>1.77>1.72; currently at baseline --Avoid nephrotoxins, renal dose all medications --Currently holding losartan as above --Repeat BMP in the a.m.  HLD: Atorvastatin 20 mg p.o. daily    DVT prophylaxis: SCDs Start: 03/13/21 0144   Code Status: Full Code Family Communication: No family present at bedside this morning.  Disposition Plan:  Level of care: Med-Surg Status is: Inpatient  Remains inpatient appropriate because:Unsafe d/c plan, IV treatments appropriate due to intensity of illness or inability to take PO, and Inpatient level of care appropriate due to severity of illness  Dispo:  Patient From: Home  Planned Disposition: Home  Medically stable for discharge: No     Consultants:  [facility]  Procedures:  None  Antimicrobials:  None   Subjective: Patient seen and examined at bedside, resting comfortably.  Continues with epigastric pain.  Tolerating clear liquid diet.  Passing flatus.  Denies bowel movement.  Seen by GI with transition from Protonix drip to IV PPI twice daily and transition clear liquids to full liquid diet  today.  Patient with no other questions or concerns at this time.  Denies headache, no chest pain, palpitations, no shortness of breath, no fever/chills/night sweats, no vomiting/diarrhea, no weakness, no fatigue, no  paresthesias.  No acute events overnight per nursing.  Objective: Vitals:   03/15/21 0000 03/15/21 0738 03/15/21 1224 03/15/21 1403  BP: (!) 169/92 (!) 160/96 (!) 178/87 (!) 170/102  Pulse: 100 100 (!) 106 (!) 102  Resp: '16 17 12 15  '$ Temp: 98.9 F (37.2 C) 98.8 F (37.1 C) 98.8 F (37.1 C) 99.1 F (37.3 C)  TempSrc: Axillary Oral Oral Oral  SpO2: 96% 99% 98% 98%  Weight:      Height:        Intake/Output Summary (Last 24 hours) at 03/15/2021 1520 Last data filed at 03/15/2021 0741 Gross per 24 hour  Intake --  Output 1650 ml  Net -1650 ml   Filed Weights   03/13/21 2030  Weight: 68.7 kg    Examination:  General exam: Appears calm and comfortable  Respiratory system: Clear to auscultation. Respiratory effort normal. Cardiovascular system: S1 & S2 heard, RRR. No JVD, murmurs, rubs, gallops or clicks. No pedal edema. Gastrointestinal system: Abdomen is nondistended, soft with mild epigastric tenderness.  No organomegaly or masses felt. Normal bowel sounds heard. Central nervous system: Alert and oriented. No focal neurological deficits. Extremities: Symmetric 5 x 5 power. Skin: No rashes, lesions or ulcers Psychiatry: Judgement and insight appear normal. Mood & affect appropriate.     Data Reviewed: I have personally reviewed following labs and imaging studies  CBC: Recent Labs  Lab 03/12/21 2017 03/13/21 0334 03/14/21 0218  WBC 8.8 10.0 9.9  NEUTROABS 6.5  --   --   HGB 12.1 12.0 11.1*  HCT 39.3 39.3 35.7*  MCV 83.3 83.4 82.1  PLT 324 307 [id number]   Basic Metabolic Panel: Recent Labs  Lab 03/12/21 2017 03/13/21 0334 03/14/21 0218  NA 144 144 145  K 3.7 4.3 3.7  CL 113* 112* 114*  CO2 19* 19* 21*  GLUCOSE 143* 191* 102*  BUN 26* 33* 33*  CREATININE 1.58* 1.77* 1.72*  CALCIUM 10.3 10.6* 10.2  MG  --  2.0  --   PHOS  --  4.6  --    GFR: Estimated Creatinine Clearance: 31.1 mL/min (A) (by C-G formula based on SCr of 1.72 mg/dL (H)). Liver Function  Tests: Recent Labs  Lab 03/12/21 2017  AST 22  ALT 20  ALKPHOS 123  BILITOT 0.8  PROT 8.0  ALBUMIN 4.0   Recent Labs  Lab 03/12/21 2017  LIPASE 28   No results for input(s): AMMONIA in the last 168 hours. Coagulation Profile: Recent Labs  Lab 03/13/21 0334  INR 1.0   Cardiac Enzymes: No results for input(s): CKTOTAL, CKMB, CKMBINDEX, TROPONINI in the last 168 hours. BNP (last 3 results) No results for input(s): PROBNP in the last 8760 hours. HbA1C: Recent Labs    03/13/21 0334  HGBA1C 7.3*   CBG: Recent Labs  Lab 03/15/21 0036 03/15/21 0446 03/15/21 0547 03/15/21 0740 03/15/21 1224  GLUCAP 153* 160* 164* 145* 178*   Lipid Profile: No results for input(s): CHOL, HDL, LDLCALC, TRIG, CHOLHDL, LDLDIRECT in the last 72 hours. Thyroid Function Tests: No results for input(s): TSH, T4TOTAL, FREET4, T3FREE, THYROIDAB in the last 72 hours. Anemia Panel: No results for input(s): VITAMINB12, FOLATE, FERRITIN, TIBC, IRON, RETICCTPCT in the last 72 hours. Sepsis Labs: No results for input(s): PROCALCITON, LATICACIDVEN in the last 168  hours.  Recent Results (from the past 240 hour(s))  Resp Panel by RT-PCR (Flu A&B, Covid) Nasopharyngeal Swab     Status: None   Collection Time: 03/12/21 11:43 PM   Specimen: Nasopharyngeal Swab; Nasopharyngeal(NP) swabs in vial transport medium  Result Value Ref Range Status   SARS Coronavirus 2 by RT PCR NEGATIVE NEGATIVE Final    Comment: (NOTE) SARS-CoV-2 target nucleic acids are NOT DETECTED.  The SARS-CoV-2 RNA is generally detectable in upper respiratory specimens during the acute phase of infection. The lowest concentration of SARS-CoV-2 viral copies this assay can detect is 138 copies/mL. A negative result does not preclude SARS-Cov-2 infection and should not be used as the sole basis for treatment or other patient management decisions. A negative result may occur with  improper specimen collection/handling, submission of  specimen other than nasopharyngeal swab, presence of viral mutation(s) within the areas targeted by this assay, and inadequate number of viral copies(<138 copies/mL). A negative result must be combined with clinical observations, patient history, and epidemiological information. The expected result is Negative.  Fact Sheet for Patients:  EntrepreneurPulse.com.au  Fact Sheet for Healthcare Providers:  IncredibleEmployment.be  This test is no t yet approved or cleared by the [place] FDA and  has been authorized for detection and/or diagnosis of SARS-CoV-2 by FDA under an Emergency Use Authorization (EUA). This EUA will remain  in effect (meaning this test can be used) for the duration of the COVID-19 declaration under Section 564(b)(1) of the Act, 21 U.S.C.section 360bbb-3(b)(1), unless the authorization is terminated  or revoked sooner.       Influenza A by PCR NEGATIVE NEGATIVE Final   Influenza B by PCR NEGATIVE NEGATIVE Final    Comment: (NOTE) The Xpert Xpress SARS-CoV-2/FLU/RSV plus assay is intended as an aid in the diagnosis of influenza from Nasopharyngeal swab specimens and should not be used as a sole basis for treatment. Nasal washings and aspirates are unacceptable for Xpert Xpress SARS-CoV-2/FLU/RSV testing.  Fact Sheet for Patients: EntrepreneurPulse.com.au  Fact Sheet for Healthcare Providers: IncredibleEmployment.be  This test is not yet approved or cleared by the [place] FDA and has been authorized for detection and/or diagnosis of SARS-CoV-2 by FDA under an Emergency Use Authorization (EUA). This EUA will remain in effect (meaning this test can be used) for the duration of the COVID-19 declaration under Section 564(b)(1) of the Act, 21 U.S.C. section 360bbb-3(b)(1), unless the authorization is terminated or revoked.  Performed at [facility], [place] [address],  [place], [place] [address]   MRSA Next Gen by PCR, Nasal     Status: None   Collection Time: 03/14/21  9:42 AM   Specimen: Nasal Mucosa; Nasal Swab  Result Value Ref Range Status   MRSA by PCR Next Gen NOT DETECTED NOT DETECTED Final    Comment: (NOTE) The GeneXpert MRSA Assay (FDA approved for NASAL specimens only), is one component of a comprehensive MRSA colonization surveillance program. It is not intended to diagnose MRSA infection nor to guide or monitor treatment for MRSA infections. Test performance is not FDA approved in patients less than 54 years old. Performed at [facility], [place] [address], [place], [place] [address]          Radiology Studies: No results found.      Scheduled Meds:  amLODipine  10 mg Oral Daily   atorvastatin  20 mg Oral Daily   brimonidine  1 drop Both Eyes BID   dorzolamide-timolol  1 drop  Both Eyes BID   gabapentin  100 mg Oral BID   insulin aspart  0-9 Units Subcutaneous Q4H   levothyroxine  88 mcg Oral Daily   metoCLOPramide (REGLAN) injection  10 mg Intravenous Q6H   pantoprazole  40 mg Intravenous Q12H   sucralfate  1 g Oral Q6H   Continuous Infusions:  lactated ringers 75 mL/hr at 03/14/21 1444     LOS: 2 days    Time spent: 38 minutes spent on chart review, discussion with nursing staff, consultants, updating family and interview/physical exam; more than 50% of that time was spent in counseling and/or coordination of care.    Alesia Oshields J [place] DO [facility] Available via Epic secure chat 7am-7pm After these hours, please refer to coverage provider listed on [email or web address] 03/15/2021, 3:20 PM

## 2021-03-15 NOTE — Progress Notes (Signed)
   03/15/21 1224  Assess: MEWS Score  Temp 98.8 F (37.1 C)  BP (!) 178/87  Pulse Rate (!) 106  ECG Heart Rate (!) 107  Resp 12  SpO2 98 %  Assess: MEWS Score  MEWS Temp 0  MEWS Systolic 0  MEWS Pulse 1  MEWS RR 1  MEWS LOC 0  MEWS Score 2  MEWS Score Color Yellow  Assess: if the MEWS score is Yellow or Red  Were vital signs taken at a resting state? Yes  Focused Assessment No change from prior assessment  Early Detection of Sepsis Score *See Row Information* Low  MEWS guidelines implemented *See Row Information* Yes  Take Vital Signs  Increase Vital Sign Frequency  Yellow: Q 2hr X 2 then Q 4hr X 2, if remains yellow, continue Q 4hrs  Escalate  MEWS: Escalate Yellow: discuss with charge nurse/RN and consider discussing with provider and RRT  Notify: Charge Nurse/RN  Name of Charge Nurse/RN Notified [email or web address]  Date Charge Nurse/RN Notified 03/15/21  Time Charge Nurse/RN Notified 1230

## 2021-03-15 NOTE — Progress Notes (Signed)
          Daily Rounding Note  03/15/2021, 2:22 PM  LOS: 2 days   SUBJECTIVE:   Chief complaint:   Epigastric pain, nausea, vomiting, isolated coffee like emesis The last time she vomited was yesterday morning.  Has been passing gas but no stool.  Epigastric pain and nausea improved.  Has not felt any tremors  OBJECTIVE:         Vital signs in last 24 hours:    Temp:  [98.7 F (37.1 C)-99.1 F (37.3 C)] 99.1 F (37.3 C) (08/05 1403) Pulse Rate:  [89-106] 102 (08/05 1403) Resp:  [12-18] 15 (08/05 1403) BP: (118-178)/(79-102) 170/102 (08/05 1403) SpO2:  [96 %-99 %] 98 % (08/05 1403) Last BM Date: 03/14/21 Filed Weights   03/13/21 2030  Weight: 68.7 kg   General: Pleasant, looks chronically unwell but overall a bit better than at arrival. Heart: Regular, slightly tachycardic. Chest: Clear bilaterally without labored breathing or cough. Abdomen: Soft, nontender, nondistended.  Active bowel sounds Extremities: CCE Neuro/Psych: Alert and oriented x3.  Pleasant.  No tremors or gross deficits.  Slurred speech.  Affect somewhat blunted.  Intake/Output from previous day: 08/04 0701 - 08/05 0700 In: -  Out: [id number] [Urine:1650]  Intake/Output this shift: Total I/O In: -  Out: 750 [Urine:750]  Lab Results: Recent Labs    03/12/21 2017 03/13/21 0334 03/14/21 0218  WBC 8.8 10.0 9.9  HGB 12.1 12.0 11.1*  HCT 39.3 39.3 35.7*  PLT 324 307 307   BMET Recent Labs    03/12/21 2017 03/13/21 0334 03/14/21 0218  NA 144 144 145  K 3.7 4.3 3.7  CL 113* 112* 114*  CO2 19* 19* 21*  GLUCOSE 143* 191* 102*  BUN 26* 33* 33*  CREATININE 1.58* 1.77* 1.72*  CALCIUM 10.3 10.6* 10.2   LFT Recent Labs    03/12/21 2017  PROT 8.0  ALBUMIN 4.0  AST 22  ALT 20  ALKPHOS 123  BILITOT 0.8   PT/INR Recent Labs    03/13/21 0334  LABPROT 13.3  INR 1.0   Hepatitis Panel No results for input(s): HEPBSAG, HCVAB, HEPAIGM, HEPBIGM in  the last 72 hours.  Studies/Results: No results found.  Scheduled Meds:  atorvastatin  20 mg Oral Daily   gabapentin  100 mg Oral BID   insulin aspart  0-9 Units Subcutaneous Q4H   levothyroxine  88 mcg Oral Daily   [START ON 03/16/2021] pantoprazole  40 mg Intravenous Q12H   sucralfate  1 g Oral Q6H   Continuous Infusions:  lactated ringers 75 mL/hr at 03/14/21 1444   pantoprazole 8 mg/hr (03/15/21 0654)   PRN Meds:.acetaminophen, dicyclomine, melatonin   ASSESMENT:   Episodic epigastric pain, nausea vomiting, coffee like emesis.  Multiple previous episodes of same with an EGD demonstrating severe esophagitis.  Suspect diabetic gastroparesis.  Protonix prescription in place, every 6 hours Carafate in place.     AKI.   PLAN     Advance to [facility] diet.   Continue Reglan for another 24 hours.  Stop Protonix drip, continue with Protonix 40 iv bid.   Continue Carafate    Azucena Freed  03/15/2021, 2:22 PM Phone [phone]

## 2021-03-16 DIAGNOSIS — Z794 Long term (current) use of insulin: Secondary | ICD-10-CM

## 2021-03-16 DIAGNOSIS — E119 Type 2 diabetes mellitus without complications: Secondary | ICD-10-CM

## 2021-03-16 LAB — BASIC METABOLIC PANEL
Anion gap: 6 (ref 5–15)
BUN: 20 mg/dL (ref 8–23)
CO2: 28 mmol/L (ref 22–32)
Calcium: 9.3 mg/dL (ref 8.9–10.3)
Chloride: 107 mmol/L (ref 98–111)
Creatinine, Ser: 1.58 mg/dL — ABNORMAL HIGH (ref 0.44–1.00)
GFR, Estimated: 37 mL/min — ABNORMAL LOW (ref >60)
Glucose, Bld: 145 mg/dL — ABNORMAL HIGH (ref 70–99)
Potassium: 3.3 mmol/L — ABNORMAL LOW (ref 3.5–5.1)
Sodium: 141 mmol/L (ref 135–145)

## 2021-03-16 LAB — CBC
HCT: 35.4 % — ABNORMAL LOW (ref 36.0–46.0)
Hemoglobin: 11.2 g/dL — ABNORMAL LOW (ref 12.0–15.0)
MCH: 25.9 pg — ABNORMAL LOW (ref 26.0–34.0)
MCHC: 31.6 g/dL (ref 30.0–36.0)
MCV: 81.8 fL (ref 80.0–100.0)
Platelets: 254 10*3/uL (ref 150–400)
RBC: 4.33 MIL/uL (ref 3.87–5.11)
RDW: 13.4 % (ref 11.5–15.5)
WBC: 8.7 10*3/uL (ref 4.0–10.5)
nRBC: 0 % (ref 0.0–0.2)

## 2021-03-16 LAB — GLUCOSE, CAPILLARY
Glucose-Capillary: 107 mg/dL — ABNORMAL HIGH (ref 70–99)
Glucose-Capillary: 107 mg/dL — ABNORMAL HIGH (ref 70–99)
Glucose-Capillary: 118 mg/dL — ABNORMAL HIGH (ref 70–99)
Glucose-Capillary: 121 mg/dL — ABNORMAL HIGH (ref 70–99)
Glucose-Capillary: 143 mg/dL — ABNORMAL HIGH (ref 70–99)
Glucose-Capillary: 169 mg/dL — ABNORMAL HIGH (ref 70–99)
Glucose-Capillary: 199 mg/dL — ABNORMAL HIGH (ref 70–99)

## 2021-03-16 MED ORDER — POTASSIUM CHLORIDE CRYS ER 20 MEQ PO TBCR
40.0000 meq | EXTENDED_RELEASE_TABLET | Freq: Once | ORAL | Status: AC
  Administered 2021-03-16: 40 meq via ORAL
  Filled 2021-03-16: qty 2

## 2021-03-16 NOTE — Progress Notes (Signed)
PROGRESS NOTE    Suzanne Pierce  [id number] DOB: 02-06-1956 DOA: 03/12/2021 PCP: Myrlene Broker, MD    Brief Narrative:  Suzanne Pierce is a 65 year old female with past medical history significant for GERD, gastritis, LA grade D esophagitis, iron deficiency anemia, HLD, DM2, diabetic polyneuropathy, HTN, hypothyroidism who presented to [facility] ED on 8/2 with abdominal pain, nausea/vomiting, coffee like emesis.  Onset 3 days prior.  Denies alcohol, NSAID or marijuana abuse.  Last EGD May 2022 in [facility], told everything was fine.  Previous EGD in the EMR 2020 with showed gastritis and LA grade D esophagitis.  In the ED, temperature 9 9.0 F, BP 125/80, HR 106, RR 17, SPO2 100% on room air.  144, potassium 3.7, chloride 113, CO2 19, BUN 26, creatinine 1.58, glucose 143.  AST 22, ALT 20, total bilirubin 0.8.  WBC 8.8, hemoglobin 12.1, platelets 324.  COVID-19 PCR negative.  Influenza A/B PCR negative.  FOBT negative.  Abdominal series with no acute cardiopulmonary disease process and negative abdominal radiographs.  Patient was started on IV fluid hydration and IV Protonix drip.  Gastroenterology consulted by EDP.  TRH consulted for further evaluation and management of abdominal pain with reported coffee-ground emesis.   Assessment & Plan:   Active Problems:   Diabetes mellitus (HCC)   Nausea and/or vomiting   Dehydration   ARF (acute renal failure) (HCC)   Type 2 diabetes mellitus with hyperlipidemia (HCC)   GI bleed   Diabetic gastroparesis Hx LA grade D esophagitis Hx gastritis Patient presenting to ED with 3-day history of nausea/vomiting with associated epigastric pain.  Reported episode of coffee like emesis.  Denies EtOH or NSAID abuse.  FOBT negative.  Hemoglobin 12.1, stable on admission. --[facility] now signed off 8/5 --Hgb 12.1>12.0>11.1  --Tolerating full liquid diet; advance to soft diet today --LR at 75 mL/h --Protonix 40 mg  IV every 12 hours --Carafate 1 g p.o. every 6 hours --Bentyl 10 mg every 6 hours as needed abdominal spasms --Supportive care  Essential hypertension Home medications include amlodipine 10 mg p.o. daily, losartan 50 mg p.o. daily --BP elevated 169/92 this morning --Restart home amlodipine 10 mg p.o. daily --Hold home losartan --Monitor BP closely  Hypothyroidism --Levothyroxine 88 mcg p.o. daily  Type 2 diabetes mellitus On Tresiba 45 units subcutaneously daily at home.  Hemoglobin A1c 7.3, well controlled. --Sensitive SSI for coverage --CBGs qAC/HS  CKD stage III Baseline creatinine 1.6-2.0 --Cr 1.58>1.77>1.72>1.58; currently at baseline --Avoid nephrotoxins, renal dose all medications --Currently holding losartan as above --Repeat BMP in the a.m.  HLD: Atorvastatin 20 mg p.o. daily    DVT prophylaxis: SCDs Start: 03/13/21 0144   Code Status: Full Code Family Communication: No family present at bedside this morning.  Disposition Plan:  Level of care: Med-Surg Status is: Inpatient  Remains inpatient appropriate because:Unsafe d/c plan, IV treatments appropriate due to intensity of illness or inability to take PO, and Inpatient level of care appropriate due to severity of illness  Dispo:  Patient From: Home  Planned Disposition: Home  Medically stable for discharge: No     Consultants:  [facility] - signed off  8/5  Procedures:  None  Antimicrobials:  None   Subjective: Patient seen and examined at bedside, resting comfortably.  Abdominal pain much improved, Tolerating full liquid diet.  Seen by GI yesterday and now signed off with recommendations of supportive care.  Plan to advance diet to soft today.  If tolerates likely discharge  home tomorrow. Patient with no other questions or concerns at this time.  Denies headache, no chest pain, palpitations, no shortness of breath, no fever/chills/night sweats, no vomiting/diarrhea, no weakness, no fatigue, no  paresthesias.  No acute events overnight per nursing.  Objective: Vitals:   03/16/21 0000 03/16/21 0400 03/16/21 0728 03/16/21 1142  BP: 120/82 140/83 137/76 (!) 152/73  Pulse: 89 78 81 85  Resp: '13 11 16 16  '$ Temp: 99.1 F (37.3 C) 98.9 F (37.2 C) 98.8 F (37.1 C) 98.9 F (37.2 C)  TempSrc: Oral Axillary Oral Oral  SpO2: 99% 98% 99% 100%  Weight:  69.9 kg    Height:        Intake/Output Summary (Last 24 hours) at 03/16/2021 1311 Last data filed at 03/16/2021 0448 Gross per 24 hour  Intake 150 ml  Output 1700 ml  Net -1550 ml   Filed Weights   03/13/21 2030 03/16/21 0400  Weight: 68.7 kg 69.9 kg    Examination:  General exam: Appears calm and comfortable  Respiratory system: Clear to auscultation. Respiratory effort normal. Cardiovascular system: S1 & S2 heard, RRR. No JVD, murmurs, rubs, gallops or clicks. No pedal edema. Gastrointestinal system: Abdomen is nondistended, soft, nontender.  No organomegaly or masses felt. Normal bowel sounds heard. Central nervous system: Alert and oriented. No focal neurological deficits. Extremities: Symmetric 5 x 5 power. Skin: No rashes, lesions or ulcers Psychiatry: Judgement and insight appear normal. Mood & affect appropriate.     Data Reviewed: I have personally reviewed following labs and imaging studies  CBC: Recent Labs  Lab 03/12/21 2017 03/13/21 0334 03/14/21 0218 03/16/21 0115  WBC 8.8 10.0 9.9 8.7  NEUTROABS 6.5  --   --   --   HGB 12.1 12.0 11.1* 11.2*  HCT 39.3 39.3 35.7* 35.4*  MCV 83.3 83.4 82.1 81.8  PLT 324 307 307 0000000   Basic Metabolic Panel: Recent Labs  Lab 03/12/21 2017 03/13/21 0334 03/14/21 0218 03/16/21 0115  NA 144 144 145 141  K 3.7 4.3 3.7 3.3*  CL 113* 112* 114* 107  CO2 19* 19* 21* 28  GLUCOSE 143* 191* 102* 145*  BUN 26* 33* 33* 20  CREATININE 1.58* 1.77* 1.72* 1.58*  CALCIUM 10.3 10.6* 10.2 9.3  MG  --  2.0  --   --   PHOS  --  4.6  --   --    GFR: Estimated Creatinine  Clearance: 34.2 mL/min (A) (by C-G formula based on SCr of 1.58 mg/dL (H)). Liver Function Tests: Recent Labs  Lab 03/12/21 2017  AST 22  ALT 20  ALKPHOS 123  BILITOT 0.8  PROT 8.0  ALBUMIN 4.0   Recent Labs  Lab 03/12/21 2017  LIPASE 28   No results for input(s): AMMONIA in the last 168 hours. Coagulation Profile: Recent Labs  Lab 03/13/21 0334  INR 1.0   Cardiac Enzymes: No results for input(s): CKTOTAL, CKMB, CKMBINDEX, TROPONINI in the last 168 hours. BNP (last 3 results) No results for input(s): PROBNP in the last 8760 hours. HbA1C: No results for input(s): HGBA1C in the last 72 hours.  CBG: Recent Labs  Lab 03/15/21 2111 03/16/21 0039 03/16/21 0446 03/16/21 0727 03/16/21 1141  GLUCAP 148* 143* 107* 121* 199*   Lipid Profile: No results for input(s): CHOL, HDL, LDLCALC, TRIG, CHOLHDL, LDLDIRECT in the last 72 hours. Thyroid Function Tests: No results for input(s): TSH, T4TOTAL, FREET4, T3FREE, THYROIDAB in the last 72 hours. Anemia Panel: No results for input(s):  VITAMINB12, FOLATE, FERRITIN, TIBC, IRON, RETICCTPCT in the last 72 hours. Sepsis Labs: No results for input(s): PROCALCITON, LATICACIDVEN in the last 168 hours.  Recent Results (from the past 240 hour(s))  Resp Panel by RT-PCR (Flu A&B, Covid) Nasopharyngeal Swab     Status: None   Collection Time: 03/12/21 11:43 PM   Specimen: Nasopharyngeal Swab; Nasopharyngeal(NP) swabs in vial transport medium  Result Value Ref Range Status   SARS Coronavirus 2 by RT PCR NEGATIVE NEGATIVE Final    Comment: (NOTE) SARS-CoV-2 target nucleic acids are NOT DETECTED.  The SARS-CoV-2 RNA is generally detectable in upper respiratory specimens during the acute phase of infection. The lowest concentration of SARS-CoV-2 viral copies this assay can detect is 138 copies/mL. A negative result does not preclude SARS-Cov-2 infection and should not be used as the sole basis for treatment or other patient management  decisions. A negative result may occur with  improper specimen collection/handling, submission of specimen other than nasopharyngeal swab, presence of viral mutation(s) within the areas targeted by this assay, and inadequate number of viral copies(<138 copies/mL). A negative result must be combined with clinical observations, patient history, and epidemiological information. The expected result is Negative.  Fact Sheet for Patients:  EntrepreneurPulse.com.au  Fact Sheet for Healthcare Providers:  IncredibleEmployment.be  This test is no t yet approved or cleared by the [place] FDA and  has been authorized for detection and/or diagnosis of SARS-CoV-2 by FDA under an Emergency Use Authorization (EUA). This EUA will remain  in effect (meaning this test can be used) for the duration of the COVID-19 declaration under Section 564(b)(1) of the Act, 21 U.S.C.section 360bbb-3(b)(1), unless the authorization is terminated  or revoked sooner.       Influenza A by PCR NEGATIVE NEGATIVE Final   Influenza B by PCR NEGATIVE NEGATIVE Final    Comment: (NOTE) The Xpert Xpress SARS-CoV-2/FLU/RSV plus assay is intended as an aid in the diagnosis of influenza from Nasopharyngeal swab specimens and should not be used as a sole basis for treatment. Nasal washings and aspirates are unacceptable for Xpert Xpress SARS-CoV-2/FLU/RSV testing.  Fact Sheet for Patients: EntrepreneurPulse.com.au  Fact Sheet for Healthcare Providers: IncredibleEmployment.be  This test is not yet approved or cleared by the [place] FDA and has been authorized for detection and/or diagnosis of SARS-CoV-2 by FDA under an Emergency Use Authorization (EUA). This EUA will remain in effect (meaning this test can be used) for the duration of the COVID-19 declaration under Section 564(b)(1) of the Act, 21 U.S.C. section 360bbb-3(b)(1), unless the  authorization is terminated or revoked.  Performed at [facility], [place] [address], [place], [place] [address]   MRSA Next Gen by PCR, Nasal     Status: None   Collection Time: 03/14/21  9:42 AM   Specimen: Nasal Mucosa; Nasal Swab  Result Value Ref Range Status   MRSA by PCR Next Gen NOT DETECTED NOT DETECTED Final    Comment: (NOTE) The GeneXpert MRSA Assay (FDA approved for NASAL specimens only), is one component of a comprehensive MRSA colonization surveillance program. It is not intended to diagnose MRSA infection nor to guide or monitor treatment for MRSA infections. Test performance is not FDA approved in patients less than 35 years old. Performed at [facility], [place] [address] [place], [place] [address]          Radiology Studies: No results found.      Scheduled Meds:  amLODipine  10 mg Oral Daily  atorvastatin  20 mg Oral Daily   brimonidine  1 drop Both Eyes BID   dorzolamide-timolol  1 drop Both Eyes BID   gabapentin  100 mg Oral BID   insulin aspart  0-9 Units Subcutaneous Q4H   levothyroxine  88 mcg Oral Daily   metoCLOPramide (REGLAN) injection  10 mg Intravenous Q6H   pantoprazole  40 mg Intravenous Q12H   sucralfate  1 g Oral Q6H   Continuous Infusions:  lactated ringers 75 mL/hr at 03/15/21 2317     LOS: 3 days    Time spent: 38 minutes spent on chart review, discussion with nursing staff, consultants, updating family and interview/physical exam; more than 50% of that time was spent in counseling and/or coordination of care.    Hazelene Doten J [place] DO [facility] Available via Epic secure chat 7am-7pm After these hours, please refer to coverage provider listed on [email or web address] 03/16/2021, 1:11 PM

## 2021-03-17 DIAGNOSIS — K209 Esophagitis, unspecified without bleeding: Secondary | ICD-10-CM

## 2021-03-17 DIAGNOSIS — K3184 Gastroparesis: Secondary | ICD-10-CM | POA: Diagnosis present

## 2021-03-17 DIAGNOSIS — N179 Acute kidney failure, unspecified: Secondary | ICD-10-CM

## 2021-03-17 DIAGNOSIS — E1143 Type 2 diabetes mellitus with diabetic autonomic (poly)neuropathy: Secondary | ICD-10-CM | POA: Diagnosis present

## 2021-03-17 LAB — GLUCOSE, CAPILLARY
Glucose-Capillary: 115 mg/dL — ABNORMAL HIGH (ref 70–99)
Glucose-Capillary: 137 mg/dL — ABNORMAL HIGH (ref 70–99)
Glucose-Capillary: 205 mg/dL — ABNORMAL HIGH (ref 70–99)

## 2021-03-17 LAB — BASIC METABOLIC PANEL
Anion gap: 7 (ref 5–15)
BUN: 22 mg/dL (ref 8–23)
CO2: 27 mmol/L (ref 22–32)
Calcium: 9.3 mg/dL (ref 8.9–10.3)
Chloride: 106 mmol/L (ref 98–111)
Creatinine, Ser: 1.53 mg/dL — ABNORMAL HIGH (ref 0.44–1.00)
GFR, Estimated: 38 mL/min — ABNORMAL LOW (ref >60)
Glucose, Bld: 109 mg/dL — ABNORMAL HIGH (ref 70–99)
Potassium: 3.9 mmol/L (ref 3.5–5.1)
Sodium: 140 mmol/L (ref 135–145)

## 2021-03-17 LAB — MAGNESIUM: Magnesium: 1.5 mg/dL — ABNORMAL LOW (ref 1.7–2.4)

## 2021-03-17 MED ORDER — PANTOPRAZOLE SODIUM 40 MG PO TBEC: DELAYED_RELEASE_TABLET | ORAL | 0 refills | Status: AC

## 2021-03-17 MED ORDER — DICYCLOMINE HCL 10 MG PO CAPS: 10.0000 mg | ORAL_CAPSULE | Freq: Four times a day (QID) | ORAL | 2 refills | Status: AC | PRN

## 2021-03-17 MED ORDER — SUCRALFATE 1 GM/10ML PO SUSP: 1.0000 g | Freq: Three times a day (TID) | ORAL | 0 refills | Status: AC

## 2021-03-17 MED ORDER — METOCLOPRAMIDE HCL 5 MG PO TABS: 5.0000 mg | ORAL_TABLET | Freq: Three times a day (TID) | ORAL | 0 refills | Status: AC | PRN

## 2021-03-17 NOTE — Progress Notes (Signed)
Patient discharged from facility at this time. All discharge instructions reviewed with patient at bedside. All questions answered. Patient verbalized and taught back understanding of all follow up care and medications to start and continue once home. IV site removed, catheter intact, pressure dressing applied. All patient belongings in disposition of the patient. Denies any further needs at this time.

## 2021-03-17 NOTE — Plan of Care (Signed)

## 2021-03-17 NOTE — Discharge Summary (Signed)
Physician Discharge Summary  ELYSSA PENDELTON ZOX:[id number] DOB: Oct 24, 1955 DOA: 03/12/2021  PCP: Myrlene Broker, MD  Admit date: 03/12/2021 Discharge date: 03/17/2021  Admitted From: Home  Disposition: Home   Recommendations for Outpatient Follow-up:  Follow up with PCP in 1-2 weeks Follow up with gastroenterology in 3 weeks Reglan every 8 hours as needed for nausea, Bentyl every 8 hours as needed for cramping pain Protonix 40 mg p.o. twice daily x1 month followed by 40 mg daily Carafate 1 g qAC/HS x 1 month   Home Health: No Equipment/Devices: None  Discharge Condition: Stable CODE STATUS: Full code Diet recommendation: Heart healthy/consistent carbohydrate diet  History of present illness:  DORANNE SCHMUTZ is a 65 year old female with past medical history significant for GERD, gastritis, LA grade D esophagitis, iron deficiency anemia, HLD, DM2, diabetic polyneuropathy, HTN, hypothyroidism who presented to [facility] ED on 8/2 with abdominal pain, nausea/vomiting, coffee like emesis.  Onset 3 days prior.  Denies alcohol, NSAID or marijuana abuse.   Last EGD May 2022 in [facility], told everything was fine.  Previous EGD in the EMR 2020 with showed gastritis and LA grade D esophagitis.   In the ED, temperature 9 9.0 F, BP 125/80, HR 106, RR 17, SPO2 100% on room air.  144, potassium 3.7, chloride 113, CO2 19, BUN 26, creatinine 1.58, glucose 143.  AST 22, ALT 20, total bilirubin 0.8.  WBC 8.8, hemoglobin 12.1, platelets 324.  COVID-19 PCR negative.  Influenza A/B PCR negative.  FOBT negative.  Abdominal series with no acute cardiopulmonary disease process and negative abdominal radiographs.  Patient was started on IV fluid hydration and IV Protonix drip.  Gastroenterology consulted by EDP.  TRH consulted for further evaluation and management of abdominal pain with reported coffee-ground emesis.  Hospital course:  Diabetic gastroparesis Hx LA grade D esophagitis Hx  gastritis Patient presenting to ED with 3-day history of nausea/vomiting with associated epigastric pain.  Reported episode of coffee like emesis.  Denies EtOH or NSAID abuse.  FOBT negative.  Hemoglobin 12.1, stable on admission.  Gastroenterology was consulted and followed during hospital course.  Patient was started on IV Protonix twice daily, clear liquid diet and Carafate, Bentyl, Reglan with improvement of symptoms and diet was slowly advanced with toleration.  We will continue Protonix 40 mg p.o. twice daily x1 month followed by 40 mg p.o. daily.  Continue Carafate 1 g before every meal/at bedtime x1 month.  Continue Bentyl and Reglan as needed for cramping pain and nausea.  Outpatient follow-up with gastroenterology in 3 weeks.   Essential hypertension Home medications include amlodipine 10 mg p.o. daily, losartan 50 mg p.o. daily.    Hypothyroidism Levothyroxine 88 mcg p.o. daily   Type 2 diabetes mellitus On Tresiba 45 units subcutaneously daily at home.  Hemoglobin A1c 7.3, well controlled.   CKD stage IIIb  Baseline creatinine 1.6-2.0.  Creatinine 1.53 at time of discharge.   HLD: Atorvastatin 20 mg p.o. daily    Discharge Diagnoses:  Active Problems:   Diabetes mellitus ([place])   Type 2 diabetes mellitus with hyperlipidemia ([place])   Acute esophagitis   Diabetic gastroparesis [facility])    Discharge Instructions  Discharge Instructions     Call MD for:  difficulty breathing, headache or visual disturbances   Complete by: As directed    Call MD for:  extreme fatigue   Complete by: As directed    Call MD for:  persistant dizziness or light-headedness   Complete by: As  directed    Call MD for:  persistant nausea and vomiting   Complete by: As directed    Call MD for:  severe uncontrolled pain   Complete by: As directed    Call MD for:  temperature >100.4   Complete by: As directed    Diet - low sodium heart healthy   Complete by: As directed    Increase activity slowly    Complete by: As directed       Allergies as of 03/17/2021       Reactions   Penicillins Hives   Did it involve swelling of the face/tongue/throat, SOB, or low BP? No, hives on the arms Did it involve sudden or severe rash/hives, skin peeling, or any reaction on the inside of your mouth or nose? No Did you need to seek medical attention at a hospital or doctor's office? Yes When did it last happen? "I was 12" If all above answers are "NO", may proceed with cephalosporin use.        Medication List     STOP taking these medications    ondansetron 4 MG tablet Commonly known as: ZOFRAN       TAKE these medications    acetaminophen 500 MG tablet Commonly known as: TYLENOL Take 500-1,000 mg by mouth every 6 (six) hours as needed for mild pain or headache.   amLODipine 10 MG tablet Commonly known as: NORVASC Take 10 mg by mouth daily.   atorvastatin 20 MG tablet Commonly known as: LIPITOR Take 20 mg by mouth daily.   BD Pen Needle Nano 2nd Gen 32G X 4 MM Misc Generic drug: Insulin Pen Needle as directed. use as directed   brimonidine 0.15 % ophthalmic solution Commonly known as: ALPHAGAN Place 1 drop into both eyes 2 (two) times daily.   dicyclomine 10 MG capsule Commonly known as: BENTYL Take 1 capsule (10 mg total) by mouth every 6 (six) hours as needed for spasms.   dorzolamide-timolol 22.3-6.8 MG/ML ophthalmic solution Commonly known as: COSOPT Place 1 drop into both eyes in the morning and at bedtime.   ferrous sulfate 325 (65 FE) MG tablet Take 325 mg by mouth daily with breakfast.   Fiasp FlexTouch 100 UNIT/ML FlexTouch Pen Generic drug: insulin aspart Inject 4-5 Units into the skin 3 (three) times daily with meals. 4 units in AM & evening. 5 units HS   Fifty50 Glucose Meter 2.0 w/Device Kit as directed.   gabapentin 300 MG capsule Commonly known as: NEURONTIN Take 300-600 mg by mouth See admin instructions. Take 300 mg by mouth every 6 hours, and  600 mg at bedtime   latanoprost 0.005 % ophthalmic solution Commonly known as: XALATAN Place 1 drop into both eyes 2 (two) times daily.   levothyroxine 88 MCG tablet Commonly known as: SYNTHROID Take 88 mcg by mouth daily.   losartan 50 MG tablet Commonly known as: COZAAR Take 50 mg by mouth daily.   meloxicam 7.5 MG tablet Commonly known as: MOBIC Take 7.5 mg by mouth as needed for pain.   metoCLOPramide 5 MG tablet Commonly known as: Reglan Take 1 tablet (5 mg total) by mouth every 8 (eight) hours as needed for nausea.   pantoprazole 40 MG tablet Commonly known as: Protonix Take 1 tablet (40 mg total) by mouth 2 (two) times daily for 30 days, THEN 1 tablet (40 mg total) daily. Start taking on: March 17, 2021 What changed: See the new instructions.   sucralfate 1 GM/10ML suspension Commonly  known as: CARAFATE Take 10 mLs (1 g total) by mouth 4 (four) times daily -  with meals and at bedtime.   tizanidine 6 MG capsule Commonly known as: ZANAFLEX Take 6 mg by mouth at bedtime as needed for muscle spasms.   Tyler Aas FlexTouch 100 UNIT/ML FlexTouch Pen Generic drug: insulin degludec Inject 45 Units into the skin daily.        Follow-up Information     Myrlene Broker, MD .   Specialty: [facility] Contact information: [place] [address] [phone]         Jerene Bears, MD Follow up in 3 week(s).   Specialty: Gastroenterology Contact information: [address] [address] [phone]                Allergies  Allergen Reactions   Penicillins Hives    Did it involve swelling of the face/tongue/throat, SOB, or low BP? No, hives on the arms Did it involve sudden or severe rash/hives, skin peeling, or any reaction on the inside of your mouth or nose? No Did you need to seek medical attention at a hospital or doctor's office? Yes When did it last happen? "I was 12" If all above answers are "NO", may  proceed with cephalosporin use.     Consultations: [facility], Dr. Hilarie Fredrickson   Procedures/Studies: DG Abdomen Acute W/Chest  Result Date: 03/13/2021 CLINICAL DATA:  Epigastric pain for 3 days.  Cough a ground emesis. EXAM: DG ABDOMEN ACUTE WITH 1 VIEW CHEST COMPARISON:  Most recent CT 12/25/2020 FINDINGS: The cardiomediastinal contours are normal. No evidence of pneumomediastinum. The lungs are clear. No pleural fluid. There is no free intra-abdominal air. No dilated bowel loops to suggest obstruction. Small volume of stool throughout the colon. Cholecystectomy clips in the right upper quadrant. No radiopaque calculi. There are vascular calcifications and pelvic phleboliths. No acute osseous abnormalities are seen. IMPRESSION: Negative abdominal radiographs. No acute cardiopulmonary disease. Electronically Signed   By: Keith Rake M.D.   On: 03/13/2021 00:50     Subjective: Patient seen examined bedside, resting comfortably.  Sitting at sink getting washed up this morning.  States tolerated soft diet yesterday without much nausea and no vomiting.  Ready for discharge home.  No other questions or concerns at this time.  Denies headache, no fever/chills/night sweats, no nausea/vomiting/diarrhea, no chest pain, no palpitations, no shortness of breath, no abdominal pain, no weakness, no fatigue, no paresthesias.  No acute events overnight per nursing staff.  Discharge Exam: Vitals:   03/17/21 0409 03/17/21 0740  BP: (!) 145/80 (!) 124/97  Pulse: 76 90  Resp: 15 17  Temp: 98.3 F (36.8 C) 98.6 F (37 C)  SpO2: 97% 99%   Vitals:   03/16/21 2023 03/16/21 2359 03/17/21 0409 03/17/21 0740  BP: 134/75 127/66 (!) 145/80 (!) 124/97  Pulse: 83 70 76 90  Resp: 15 15 15 17   Temp: 99 F (37.2 C) 98 F (36.7 C) 98.3 F (36.8 C) 98.6 F (37 C)  TempSrc: Axillary Oral Axillary Oral  SpO2: 93% 96% 97% 99%  Weight:      Height:        General: Pt is alert, awake, not in acute  distress Cardiovascular: RRR, S1/S2 +, no rubs, no gallops Respiratory: CTA bilaterally, no wheezing, no rhonchi, on room air Abdominal: Soft, NT, ND, bowel sounds + Extremities: no edema, no cyanosis    The results of significant diagnostics from  this hospitalization (including imaging, microbiology, ancillary and laboratory) are listed below for reference.     Microbiology: Recent Results (from the past 240 hour(s))  Resp Panel by RT-PCR (Flu A&B, Covid) Nasopharyngeal Swab     Status: None   Collection Time: 03/12/21 11:43 PM   Specimen: Nasopharyngeal Swab; Nasopharyngeal(NP) swabs in vial transport medium  Result Value Ref Range Status   SARS Coronavirus 2 by RT PCR NEGATIVE NEGATIVE Final    Comment: (NOTE) SARS-CoV-2 target nucleic acids are NOT DETECTED.  The SARS-CoV-2 RNA is generally detectable in upper respiratory specimens during the acute phase of infection. The lowest concentration of SARS-CoV-2 viral copies this assay can detect is 138 copies/mL. A negative result does not preclude SARS-Cov-2 infection and should not be used as the sole basis for treatment or other patient management decisions. A negative result may occur with  improper specimen collection/handling, submission of specimen other than nasopharyngeal swab, presence of viral mutation(s) within the areas targeted by this assay, and inadequate number of viral copies(<138 copies/mL). A negative result must be combined with clinical observations, patient history, and epidemiological information. The expected result is Negative.  Fact Sheet for Patients:  EntrepreneurPulse.com.au  Fact Sheet for Healthcare Providers:  IncredibleEmployment.be  This test is no t yet approved or cleared by the [place] FDA and  has been authorized for detection and/or diagnosis of SARS-CoV-2 by FDA under an Emergency Use Authorization (EUA). This EUA will remain  in effect  (meaning this test can be used) for the duration of the COVID-19 declaration under Section 564(b)(1) of the Act, 21 U.S.C.section 360bbb-3(b)(1), unless the authorization is terminated  or revoked sooner.       Influenza A by PCR NEGATIVE NEGATIVE Final   Influenza B by PCR NEGATIVE NEGATIVE Final    Comment: (NOTE) The Xpert Xpress SARS-CoV-2/FLU/RSV plus assay is intended as an aid in the diagnosis of influenza from Nasopharyngeal swab specimens and should not be used as a sole basis for treatment. Nasal washings and aspirates are unacceptable for Xpert Xpress SARS-CoV-2/FLU/RSV testing.  Fact Sheet for Patients: EntrepreneurPulse.com.au  Fact Sheet for Healthcare Providers: IncredibleEmployment.be  This test is not yet approved or cleared by the [place] FDA and has been authorized for detection and/or diagnosis of SARS-CoV-2 by FDA under an Emergency Use Authorization (EUA). This EUA will remain in effect (meaning this test can be used) for the duration of the COVID-19 declaration under Section 564(b)(1) of the Act, 21 U.S.C. section 360bbb-3(b)(1), unless the authorization is terminated or revoked.  Performed at [facility], [place] [address], [place], [place] [address]   MRSA Next Gen by PCR, Nasal     Status: None   Collection Time: 03/14/21  9:42 AM   Specimen: Nasal Mucosa; Nasal Swab  Result Value Ref Range Status   MRSA by PCR Next Gen NOT DETECTED NOT DETECTED Final    Comment: (NOTE) The GeneXpert MRSA Assay (FDA approved for NASAL specimens only), is one component of a comprehensive MRSA colonization surveillance program. It is not intended to diagnose MRSA infection nor to guide or monitor treatment for MRSA infections. Test performance is not FDA approved in patients less than 54 years old. Performed at [facility], [place] [address], [place],  [address]      Labs: BNP (last 3 results) No results  for input(s): BNP in the last 8760 hours. Basic Metabolic Panel: Recent Labs  Lab 03/12/21 2017 03/13/21 0334 03/14/21 0218 03/16/21 0115  03/17/21 0308  NA 144 144 145 141 140  K 3.7 4.3 3.7 3.3* 3.9  CL 113* 112* 114* 107 106  CO2 19* 19* 21* 28 27  GLUCOSE 143* 191* 102* 145* 109*  BUN 26* 33* 33* 20 22  CREATININE 1.58* 1.77* 1.72* 1.58* 1.53*  CALCIUM 10.3 10.6* 10.2 9.3 9.3  MG  --  2.0  --   --  1.5*  PHOS  --  4.6  --   --   --    Liver Function Tests: Recent Labs  Lab 03/12/21 2017  AST 22  ALT 20  ALKPHOS 123  BILITOT 0.8  PROT 8.0  ALBUMIN 4.0   Recent Labs  Lab 03/12/21 2017  LIPASE 28   No results for input(s): AMMONIA in the last 168 hours. CBC: Recent Labs  Lab 03/12/21 2017 03/13/21 0334 03/14/21 0218 03/16/21 0115  WBC 8.8 10.0 9.9 8.7  NEUTROABS 6.5  --   --   --   HGB 12.1 12.0 11.1* 11.2*  HCT 39.3 39.3 35.7* 35.4*  MCV 83.3 83.4 82.1 81.8  PLT 324 307 307 254   Cardiac Enzymes: No results for input(s): CKTOTAL, CKMB, CKMBINDEX, TROPONINI in the last 168 hours. BNP: Invalid input(s): POCBNP CBG: Recent Labs  Lab 03/16/21 1555 03/16/21 2008 03/16/21 2357 03/17/21 0412 03/17/21 0742  GLUCAP 118* 169* 107* 115* 137*   D-Dimer No results for input(s): DDIMER in the last 72 hours. Hgb A1c No results for input(s): HGBA1C in the last 72 hours. Lipid Profile No results for input(s): CHOL, HDL, LDLCALC, TRIG, CHOLHDL, LDLDIRECT in the last 72 hours. Thyroid function studies No results for input(s): TSH, T4TOTAL, T3FREE, THYROIDAB in the last 72 hours.  Invalid input(s): FREET3 Anemia work up No results for input(s): VITAMINB12, FOLATE, FERRITIN, TIBC, IRON, RETICCTPCT in the last 72 hours. Urinalysis    Component Value Date/Time   COLORURINE YELLOW 03/12/2021 2005   APPEARANCEUR CLOUDY (A) 03/12/2021 2005   LABSPEC 1.013 03/12/2021 2005   PHURINE 5.0 03/12/2021 2005   GLUCOSEU NEGATIVE 03/12/2021 2005   HGBUR NEGATIVE  03/12/2021 2005   [address] NEGATIVE 03/12/2021 2005   [place] NEGATIVE 03/12/2021 2005   PROTEINUR 100 (A) 03/12/2021 2005   NITRITE NEGATIVE 03/12/2021 2005   LEUKOCYTESUR LARGE (A) 03/12/2021 2005   Sepsis Labs Invalid input(s): PROCALCITONIN,  WBC,  LACTICIDVEN Microbiology Recent Results (from the past 240 hour(s))  Resp Panel by RT-PCR (Flu A&B, Covid) Nasopharyngeal Swab     Status: None   Collection Time: 03/12/21 11:43 PM   Specimen: Nasopharyngeal Swab; Nasopharyngeal(NP) swabs in vial transport medium  Result Value Ref Range Status   SARS Coronavirus 2 by RT PCR NEGATIVE NEGATIVE Final    Comment: (NOTE) SARS-CoV-2 target nucleic acids are NOT DETECTED.  The SARS-CoV-2 RNA is generally detectable in upper respiratory specimens during the acute phase of infection. The lowest concentration of SARS-CoV-2 viral copies this assay can detect is 138 copies/mL. A negative result does not preclude SARS-Cov-2 infection and should not be used as the sole basis for treatment or other patient management decisions. A negative result may occur with  improper specimen collection/handling, submission of specimen other than nasopharyngeal swab, presence of viral mutation(s) within the areas targeted by this assay, and inadequate number of viral copies(<138 copies/mL). A negative result must be combined with clinical observations, patient history, and epidemiological information. The expected result is Negative.  Fact Sheet for Patients:  EntrepreneurPulse.com.au  Fact Sheet for Healthcare Providers:  IncredibleEmployment.be  This test is no t yet approved or cleared by the [place] and  has been authorized for detection and/or diagnosis of SARS-CoV-2 by FDA under an Emergency Use Authorization (EUA). This EUA will remain  in effect (meaning this test can be used) for the duration of the COVID-19 declaration under Section 564(b)(1) of  the Act, 21 U.S.C.section 360bbb-3(b)(1), unless the authorization is terminated  or revoked sooner.       Influenza A by PCR NEGATIVE NEGATIVE Final   Influenza B by PCR NEGATIVE NEGATIVE Final    Comment: (NOTE) The Xpert Xpress SARS-CoV-2/FLU/RSV plus assay is intended as an aid in the diagnosis of influenza from Nasopharyngeal swab specimens and should not be used as a sole basis for treatment. Nasal washings and aspirates are unacceptable for Xpert Xpress SARS-CoV-2/FLU/RSV testing.  Fact Sheet for Patients: EntrepreneurPulse.com.au  Fact Sheet for Healthcare Providers: IncredibleEmployment.be  This test is not yet approved or cleared by the [place] FDA and has been authorized for detection and/or diagnosis of SARS-CoV-2 by FDA under an Emergency Use Authorization (EUA). This EUA will remain in effect (meaning this test can be used) for the duration of the COVID-19 declaration under Section 564(b)(1) of the Act, 21 U.S.C. section 360bbb-3(b)(1), unless the authorization is terminated or revoked.  Performed at [facility], [place] [address], [place], [place] [address]   MRSA Next Gen by PCR, Nasal     Status: None   Collection Time: 03/14/21  9:42 AM   Specimen: Nasal Mucosa; Nasal Swab  Result Value Ref Range Status   MRSA by PCR Next Gen NOT DETECTED NOT DETECTED Final    Comment: (NOTE) The GeneXpert MRSA Assay (FDA approved for NASAL specimens only), is one component of a comprehensive MRSA colonization surveillance program. It is not intended to diagnose MRSA infection nor to guide or monitor treatment for MRSA infections. Test performance is not FDA approved in patients less than 76 years old. Performed at [facility], [place] [address], [place], [place] [address]      Time coordinating discharge: Over 30 minutes  SIGNED:   Arryanna Holquin J [place] DO  [facility] 03/17/2021, 10:21 AM

## 2021-05-07 ENCOUNTER — Emergency Department (HOSPITAL_COMMUNITY)
Admission: EM | Admit: 2021-05-07 | Discharge: 2021-05-07 | Disposition: A | Discharge status: Home / Self Care | Payer: Medicare HMO | Attending: Emergency Medicine | Admitting: Emergency Medicine

## 2021-05-07 ENCOUNTER — Other Ambulatory Visit: Payer: Self-pay

## 2021-05-07 ENCOUNTER — Encounter (HOSPITAL_COMMUNITY): Payer: Self-pay | Admitting: Emergency Medicine

## 2021-05-07 DIAGNOSIS — Z79899 Other long term (current) drug therapy: Secondary | ICD-10-CM | POA: Insufficient documentation

## 2021-05-07 DIAGNOSIS — I10 Essential (primary) hypertension: Secondary | ICD-10-CM | POA: Diagnosis not present

## 2021-05-07 DIAGNOSIS — E119 Type 2 diabetes mellitus without complications: Secondary | ICD-10-CM | POA: Insufficient documentation

## 2021-05-07 DIAGNOSIS — E039 Hypothyroidism, unspecified: Secondary | ICD-10-CM | POA: Insufficient documentation

## 2021-05-07 DIAGNOSIS — K3184 Gastroparesis: Secondary | ICD-10-CM | POA: Insufficient documentation

## 2021-05-07 DIAGNOSIS — K219 Gastro-esophageal reflux disease without esophagitis: Secondary | ICD-10-CM | POA: Insufficient documentation

## 2021-05-07 DIAGNOSIS — R109 Unspecified abdominal pain: Secondary | ICD-10-CM | POA: Diagnosis present

## 2021-05-07 LAB — LIPASE, BLOOD: Lipase: 22 U/L (ref 11–51)

## 2021-05-07 LAB — COMPREHENSIVE METABOLIC PANEL
ALT: 17 U/L (ref 0–44)
AST: 19 U/L (ref 15–41)
Albumin: 4.2 g/dL (ref 3.5–5.0)
Alkaline Phosphatase: 131 U/L — ABNORMAL HIGH (ref 38–126)
Anion gap: 14 (ref 5–15)
BUN: 23 mg/dL (ref 8–23)
CO2: 25 mmol/L (ref 22–32)
Calcium: 10.6 mg/dL — ABNORMAL HIGH (ref 8.9–10.3)
Chloride: 105 mmol/L (ref 98–111)
Creatinine, Ser: 1.53 mg/dL — ABNORMAL HIGH (ref 0.44–1.00)
GFR, Estimated: 38 mL/min — ABNORMAL LOW (ref >60)
Glucose, Bld: 279 mg/dL — ABNORMAL HIGH (ref 70–99)
Potassium: 3.7 mmol/L (ref 3.5–5.1)
Sodium: 144 mmol/L (ref 135–145)
Total Bilirubin: 0.4 mg/dL (ref 0.3–1.2)
Total Protein: 8.2 g/dL — ABNORMAL HIGH (ref 6.5–8.1)

## 2021-05-07 LAB — CBC
HCT: 42.8 % (ref 36.0–46.0)
Hemoglobin: 13.3 g/dL (ref 12.0–15.0)
MCH: 25.2 pg — ABNORMAL LOW (ref 26.0–34.0)
MCHC: 31.1 g/dL (ref 30.0–36.0)
MCV: 81.1 fL (ref 80.0–100.0)
Platelets: 347 10*3/uL (ref 150–400)
RBC: 5.28 MIL/uL — ABNORMAL HIGH (ref 3.87–5.11)
RDW: 13.7 % (ref 11.5–15.5)
WBC: 12.2 10*3/uL — ABNORMAL HIGH (ref 4.0–10.5)
nRBC: 0 % (ref 0.0–0.2)

## 2021-05-07 MED ORDER — PROCHLORPERAZINE EDISYLATE 10 MG/2ML IJ SOLN
10.0000 mg | Freq: Once | INTRAMUSCULAR | Status: AC
  Administered 2021-05-07: 10 mg via INTRAVENOUS
  Filled 2021-05-07: qty 2

## 2021-05-07 MED ORDER — DICYCLOMINE HCL 10 MG/ML IM SOLN
20.0000 mg | Freq: Once | INTRAMUSCULAR | Status: AC
  Administered 2021-05-07: 20 mg via INTRAMUSCULAR
  Filled 2021-05-07: qty 2

## 2021-05-07 MED ORDER — LORAZEPAM 2 MG/ML IJ SOLN
0.5000 mg | Freq: Once | INTRAMUSCULAR | Status: AC
  Administered 2021-05-07: 0.5 mg via INTRAVENOUS
  Filled 2021-05-07: qty 1

## 2021-05-07 MED ORDER — SODIUM CHLORIDE 0.9 % IV BOLUS
1000.0000 mL | Freq: Once | INTRAVENOUS | Status: AC
  Administered 2021-05-07: 1000 mL via INTRAVENOUS

## 2021-05-07 NOTE — ED Triage Notes (Signed)
Patient coming from home, complaint of abdominal pain that started yesterday, several episodes of emesis. VSS. NAD.

## 2021-05-07 NOTE — ED Provider Notes (Signed)
[facility] EMERGENCY DEPARTMENT Provider Note   CSN: [id number] Arrival date & time: 05/07/21  1624     History Chief Complaint  Patient presents with   Abdominal Pain    Suzanne Pierce is a 65 y.o. female.  HPI Suzanne Pierce presents by private vehicle for evaluation of abdominal pain and multiple episodes of vomiting.  Onset of symptoms yesterday.  Complicated medical history including paresis, and chronic abdominal pain.  Prior ED evaluations for same.  Suzanne Pierce had a positive COVID antigen test, 04/26/2021.  Suzanne Pierce was recently hospitalized for upper GI bleeding, medically managed.  Prior EGD, May 2022 was normal.  Patient states Suzanne Pierce had hematemesis at onset of vomiting, yesterday.  Suzanne Pierce pain is, as before, in the epigastrium.  Suzanne Pierce is here in the ED with Suzanne Pierce sister.  Suzanne Pierce watch one of the grandchildren and he is at home with them.  There are no other known active modifying factors.    Past Medical History:  Diagnosis Date   Diabetes ([place])    Family history of colon cancer    GERD (gastroesophageal reflux disease)    History of colon polyps    Hyperlipidemia    Hypertension    Hypothyroidism    Iron deficiency anemia    Irritable bowel syndrome (IBS)    with diarrhea   Kidney stones    Trigger index finger of right hand     Patient Active Problem List   Diagnosis Date Noted   Diabetic gastroparesis ([place]) 03/17/2021   Acute esophagitis    Type 2 diabetes mellitus with hyperlipidemia ([place]) 06/16/2019   Abdominal pain, chronic, epigastric    Hyperglycemia 06/15/2019   Diabetes mellitus ([place]) 12/09/2011   Overweight(278.02) 12/09/2011   S/P hysterectomy 12/09/2011   Vitiligo 12/09/2011    Past Surgical History:  Procedure Laterality Date   BIOPSY  06/17/2019   Procedure: BIOPSY;  Surgeon: Thornton Park, MD;  Location: [place];  Service: Gastroenterology;;   COLONOSCOPY  12/19/2015   Mild sigmoid diverticulosis. Small internal  hemorrhoids   ESOPHAGOGASTRODUODENOSCOPY  10/31/2015   Normal EGD   ESOPHAGOGASTRODUODENOSCOPY (EGD) WITH PROPOFOL N/A 06/17/2019   Procedure: ESOPHAGOGASTRODUODENOSCOPY (EGD) WITH PROPOFOL;  Surgeon: Thornton Park, MD;  Location: [place];  Service: Gastroenterology;  Laterality: N/A;   LITHOTRIPSY  01/2014   PARTIAL HYSTERECTOMY  2002     OB History   No obstetric history on file.     Family History  Problem Relation Age of Onset   Colon cancer Father    Rectal cancer Neg Hx     Social History   Tobacco Use   Smoking status: Never   Smokeless tobacco: Never  Vaping Use   Vaping Use: Never used  Substance Use Topics   Alcohol use: No   Drug use: No    Home Medications Prior to Admission medications   Medication Sig Start Date End Date Taking? Authorizing Provider  acetaminophen (TYLENOL) 500 MG tablet Take 500-1,000 mg by mouth every 6 (six) hours as needed for mild pain or headache.    [provider]  amLODipine (NORVASC) 10 MG tablet Take 10 mg by mouth daily.    [provider]  atorvastatin (LIPITOR) 20 MG tablet Take 20 mg by mouth daily. 10/07/19   [provider]  BD PEN NEEDLE NANO 2ND GEN 32G X 4 MM MISC as directed. use as directed 03/15/19   [provider]  Blood Glucose Monitoring Suppl (FIFTY50 GLUCOSE METER 2.0) w/Device  KIT as directed. 11/22/18   [provider]  brimonidine (ALPHAGAN) 0.15 % ophthalmic solution Place 1 drop into both eyes 2 (two) times daily. 03/08/21   [provider]  dicyclomine (BENTYL) 10 MG capsule Take 1 capsule (10 mg total) by mouth every 6 (six) hours as needed for spasms. 03/17/21 04/16/21  [place] Donnamarie Poag, DO  dorzolamide-timolol (COSOPT) 22.3-6.8 MG/ML ophthalmic solution Place 1 drop into both eyes in the morning and at bedtime. 01/08/21   [provider]  ferrous sulfate 325 (65 FE) MG tablet Take 325 mg by mouth daily with breakfast.    [provider]   FIASP FLEXTOUCH 100 UNIT/ML FlexTouch Pen Inject 4-5 Units into the skin 3 (three) times daily with meals. 4 units in AM & evening. 5 units HS 12/17/20   [provider]  gabapentin (NEURONTIN) 300 MG capsule Take 300-600 mg by mouth See admin instructions. Take 300 mg by mouth every 6 hours, and 600 mg at bedtime 03/10/19   [provider]  latanoprost (XALATAN) 0.005 % ophthalmic solution Place 1 drop into both eyes 2 (two) times daily.    [provider]  levothyroxine (SYNTHROID, LEVOTHROID) 88 MCG tablet Take 88 mcg by mouth daily.    [provider]  losartan (COZAAR) 50 MG tablet Take 50 mg by mouth daily. 11/06/20   [provider]  meloxicam (MOBIC) 7.5 MG tablet Take 7.5 mg by mouth as needed for pain.    [provider]  metoCLOPramide (REGLAN) 5 MG tablet Take 1 tablet (5 mg total) by mouth every 8 (eight) hours as needed for nausea. 03/17/21 05/16/21  [place] Donnamarie Poag, DO  pantoprazole (PROTONIX) 40 MG tablet Take 1 tablet (40 mg total) by mouth 2 (two) times daily for 30 days, THEN 1 tablet (40 mg total) daily. 03/17/21 07/15/21  [place] Donnamarie Poag, DO  sucralfate (CARAFATE) 1 GM/10ML suspension Take 10 mLs (1 g total) by mouth 4 (four) times daily -  with meals and at bedtime. 03/17/21 04/16/21  [place] Donnamarie Poag, DO  tizanidine (ZANAFLEX) 6 MG capsule Take 6 mg by mouth at bedtime as needed for muscle spasms. 03/12/21   [provider]  TRESIBA FLEXTOUCH 100 UNIT/ML FlexTouch Pen Inject 45 Units into the skin daily. 12/17/20   [provider]    Allergies    Penicillins  Review of Systems   Review of Systems  All other systems reviewed and are negative.  Physical Exam Updated Vital Signs BP 140/90   Pulse (!) 109   Temp 97.9 F (36.6 C) (Oral)   Resp 16   SpO2 100%   Physical Exam Vitals and nursing note reviewed.  Constitutional:      General: Suzanne Pierce (Uncomfortable).     Appearance: Suzanne Pierce is  well-developed. Suzanne Pierce is not ill-appearing, toxic-appearing or diaphoretic.  HENT:     Pierce: Normocephalic and atraumatic.     Right Ear: External ear normal.     Left Ear: External ear normal.     Mouth/Throat:     Mouth: Mucous membranes are moist.  Eyes:     Conjunctiva/sclera: Conjunctivae normal.     Pupils: Pupils are equal, round, and reactive to light.  Neck:     Trachea: Phonation normal.  Cardiovascular:     Rate and Rhythm: Normal rate and regular rhythm.     Heart sounds: Normal heart sounds.  Pulmonary:     Effort: Pulmonary effort is normal.  Breath sounds: Normal breath sounds.  Abdominal:     General: There is no distension.     Palpations: Abdomen is soft.     Tenderness: There is no abdominal tenderness.  Musculoskeletal:        General: Normal range of motion.     Cervical back: Normal range of motion and neck supple.  Skin:    General: Skin is warm and dry.  Neurological:     Mental Status: Suzanne Pierce is alert and oriented to person, place, and time.     Cranial Nerves: No cranial nerve deficit.     Sensory: No sensory deficit.     Motor: No abnormal muscle tone.     Coordination: Coordination normal.  Psychiatric:        Mood and Affect: Mood normal.        Behavior: Behavior normal.        Thought Content: Thought content normal.        Judgment: Judgment normal.    ED Results / Procedures / Treatments   Labs (all labs ordered are listed, but only abnormal results are displayed) Labs Reviewed  COMPREHENSIVE METABOLIC PANEL - Abnormal; Notable for the following components:      Result Value   Glucose, Bld 279 (*)    Creatinine, Ser 1.53 (*)    Calcium 10.6 (*)    Total Protein 8.2 (*)    Alkaline Phosphatase 131 (*)    GFR, Estimated 38 (*)    All other components within normal limits  CBC - Abnormal; Notable for the following components:   WBC 12.2 (*)    RBC 5.28 (*)    MCH 25.2 (*)    All other components within normal limits  LIPASE, BLOOD   URINALYSIS, ROUTINE W REFLEX MICROSCOPIC    EKG None  Radiology No results found.  Procedures Procedures   Medications Ordered in ED Medications  sodium chloride 0.9 % bolus 1,000 mL (0 mLs Intravenous Stopped 05/07/21 1157)  prochlorperazine (COMPAZINE) injection 10 mg (10 mg Intravenous Given 05/07/21 1037)  LORazepam (ATIVAN) injection 0.5 mg (0.5 mg Intravenous Given 05/07/21 1038)  dicyclomine (BENTYL) injection 20 mg (20 mg Intramuscular Given 05/07/21 1040)    ED Course  I have reviewed the triage vital signs and the nursing notes.  Pertinent labs & imaging results that were available during my care of the patient were reviewed by me and considered in my medical decision making (see chart for details).  Clinical Course as of 05/07/21 1841  Tue May 07, 2021  1338 Suzanne Pierce states that Suzanne Pierce is feeling better and would like to try oral nutrition. [EW]    Clinical Course User Index [EW] Daleen Bo, MD   MDM Rules/Calculators/A&P                            Patient Vitals for the past 24 hrs:  BP Temp Temp src Pulse Resp SpO2  05/07/21 1300 140/90 -- -- (!) 109 16 100 %  05/07/21 1230 (!) 135/98 -- -- (!) 107 14 100 %  05/07/21 1130 134/89 -- -- (!) 107 19 99 %  05/07/21 1100 129/87 -- -- (!) 109 13 99 %  05/07/21 1045 (!) 164/152 -- -- (!) 113 16 100 %  05/07/21 1020 -- -- -- (!) 112 19 100 %  05/07/21 1019 (!) 159/103 -- -- -- -- --  05/07/21 0945 (!) 147/90 97.9 F (36.6 C) Oral (!) 116  17 100 %    3:47 PM Reevaluation with update and discussion. After initial assessment and treatment, an updated evaluation reveals Suzanne Pierce states Suzanne Pierce feels better and is ready to go home.  Findings discussed with the patient and all questions were answered.  Suzanne Pierce states Suzanne Pierce daughter will be coming to get Suzanne Pierce.Daleen Bo   Medical Decision Making:  This patient is presenting for evaluation of recurrent vomiting and abdominal pain, which does require a range of treatment options, and  is a complaint that involves a moderate risk of morbidity and mortality. The differential diagnoses include gastroparesis, complications from COVID infection, metabolic disorder, chronic pain. I decided to review old records, and in summary elderly female, with frequent episodes of abdominal pain and vomiting.  Recently Suzanne Pierce has been hospitalized and seen by Suzanne Pierce PCP, to manage the same problem.  Suzanne Pierce presentation today is similar to prior presentations.  I obtained additional historical information from sister, at bedside.  Clinical Laboratory Tests Ordered, included CBC, Metabolic panel, and lipase . Review indicates normal except glucose high, creatinine high, calcium 9, total protein high, alk phos stays high, GFR low, white count high, RBC high.   Critical Interventions-clinical evaluation, laboratory testing, IV fluids, medication treatment, observation and reassessment  After These Interventions, the Patient was reevaluated and was found improved without ongoing vomiting.  Stable for discharge.  Recurrent symptoms of gastroparesis, likely diagnosis.  No indication for hospitalization at this time  CRITICAL CARE-no Performed by: Daleen Bo  Nursing Notes Reviewed/ Care Coordinated Applicable Imaging Reviewed Interpretation of Laboratory Data incorporated into ED treatment  The patient appears reasonably screened and/or stabilized for discharge and I doubt any other medical condition or other [facility] requiring further screening, evaluation, or treatment in the ED at this time prior to discharge.  Plan: Home Medications-continue usual; Home Treatments-rest, fluids; return here if the recommended treatment, does not improve the symptoms; Recommended follow up-PCP, as needed     Final Clinical Impression(s) / ED Diagnoses Final diagnoses:  Gastroparesis    Rx / DC Orders ED Discharge Orders     None        Daleen Bo, MD 05/07/21 1843

## 2021-05-07 NOTE — Discharge Instructions (Addendum)
The test today, are reassuring.  Continue using your usual medications.  Return here if needed for problems.

## 2021-05-07 NOTE — ED Notes (Signed)
Pt d/c home per MD order. Discharge summary reviewed with pt, pt verbalizes understanding. No s/s of acute distress noted at discharge. Reports daughter is discharge ride home.

## 2022-01-17 IMAGING — CR DG ABDOMEN ACUTE W/ 1V CHEST
3 series · 3 of 3 positions shown · non-contrast
Comparison: Most recent CT 12/25/2020

CLINICAL DATA: Epigastric pain for 3 days.  Cough a ground emesis.

EXAM:
DG ABDOMEN ACUTE WITH 1 VIEW CHEST

[chest pa]
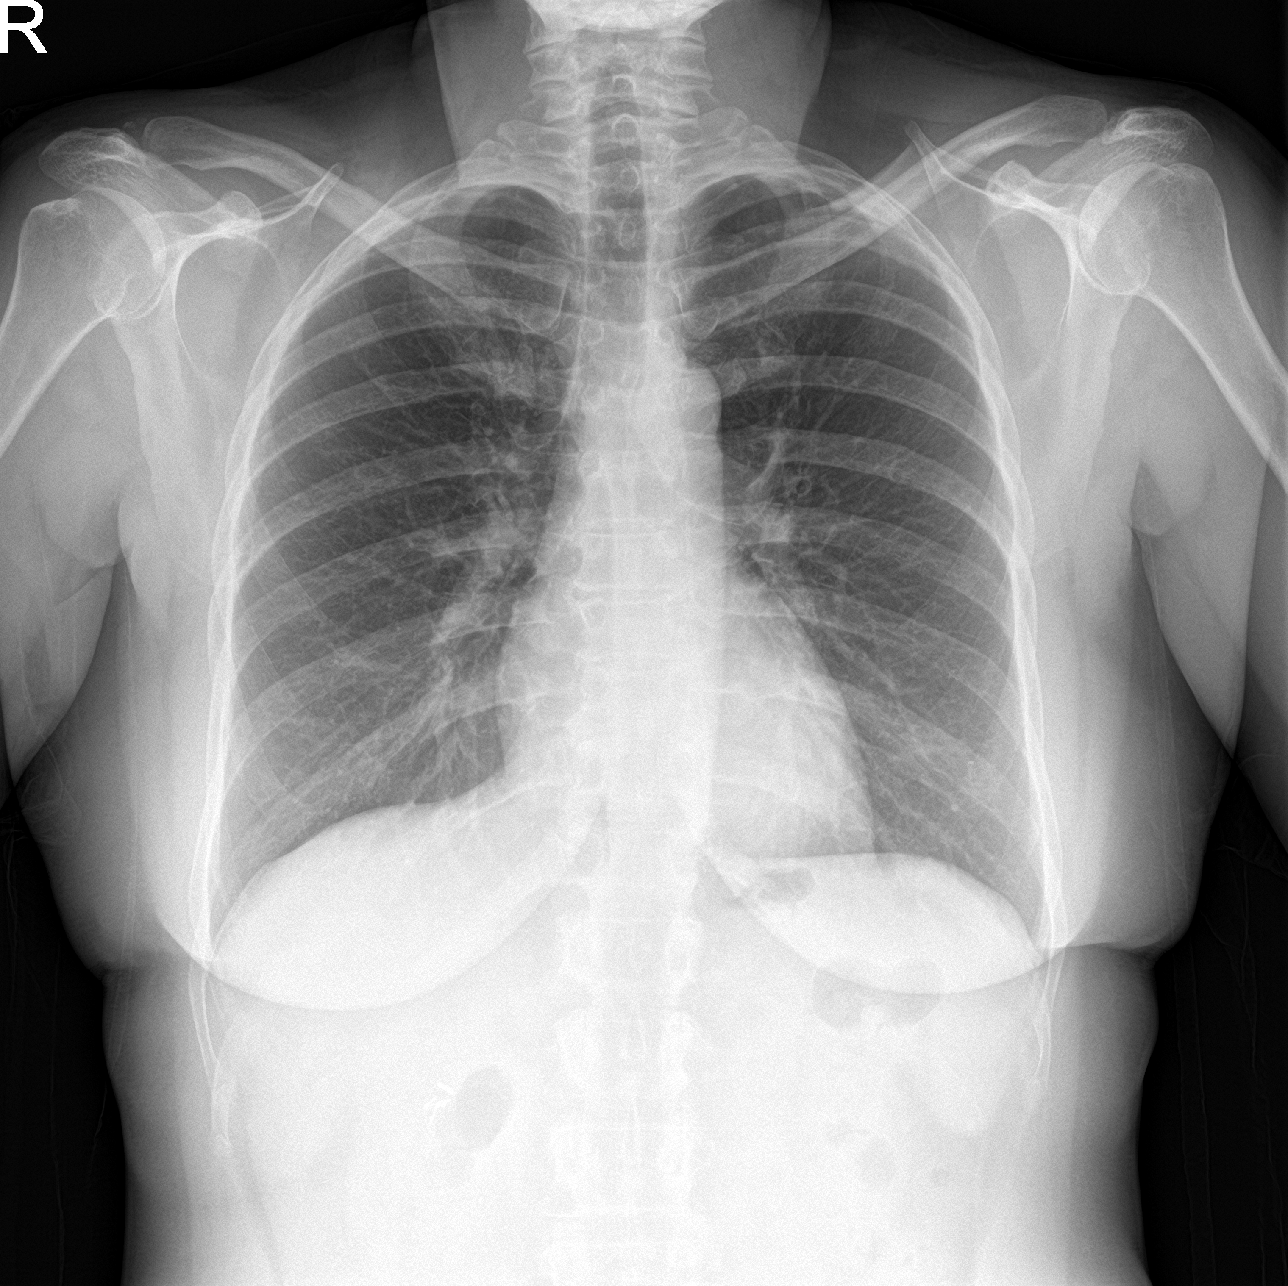

[abdomen erect]
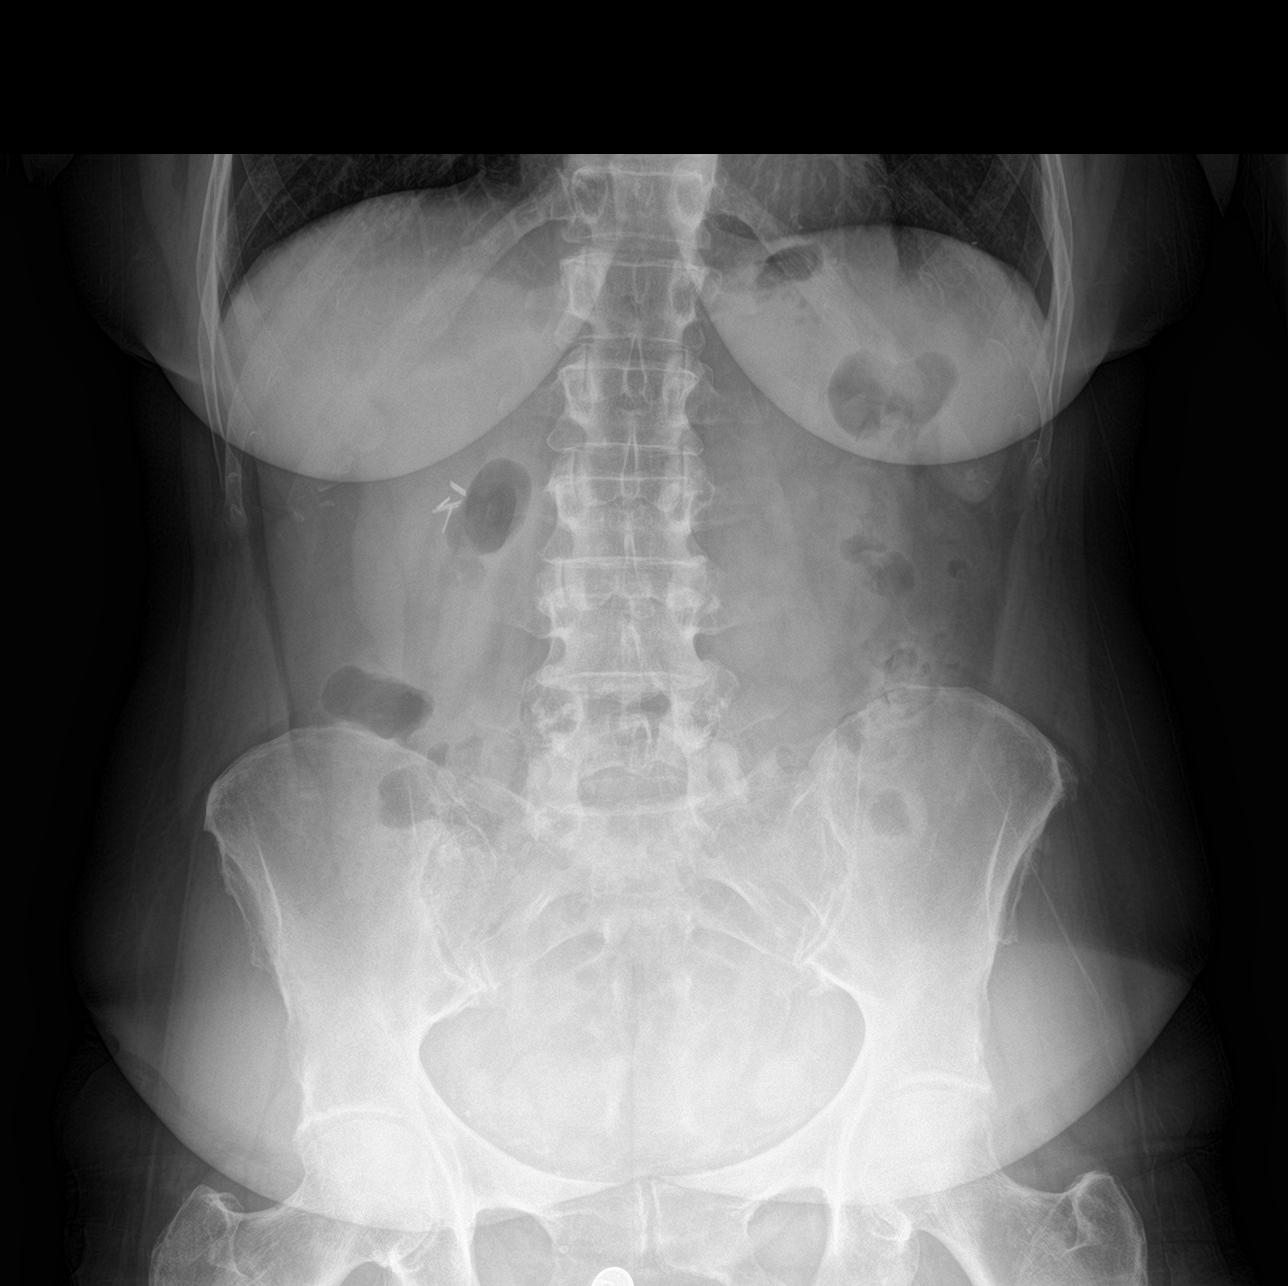

[abdomen supine]
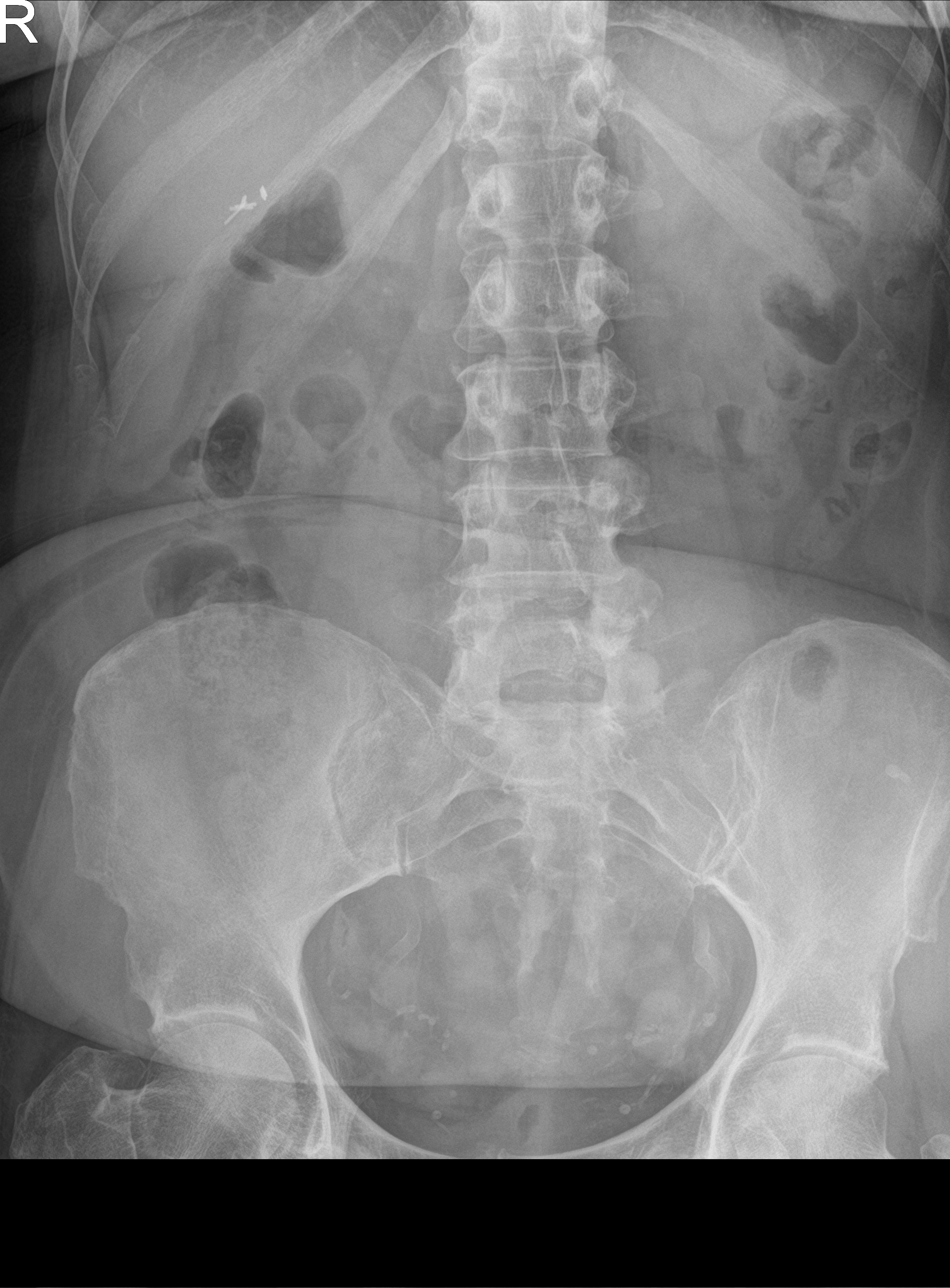

[3 of 3 positions shown; findings below may reference images not displayed]

FINDINGS: The cardiomediastinal contours are normal. No evidence of
pneumomediastinum. The lungs are clear. No pleural fluid. There is
no free intra-abdominal air. No dilated bowel loops to suggest
obstruction. Small volume of stool throughout the colon.
Cholecystectomy clips in the right upper quadrant. No radiopaque
calculi. There are vascular calcifications and pelvic phleboliths.
No acute osseous abnormalities are seen.
IMPRESSION: Negative abdominal radiographs. No acute cardiopulmonary disease.
